# Patient Record
Sex: Female | Born: 1995 | Race: Black or African American | Hispanic: No | Marital: Single | Smoking: Never smoker
Health system: Southern US, Community
[De-identification: ages and names within clinical notes are randomized; demographics above are authoritative.]

## PROBLEM LIST (undated history)

## (undated) HISTORY — PX: NO PAST SURGERIES: SHX2092

---

## 2016-05-29 ENCOUNTER — Encounter (HOSPITAL_COMMUNITY): Payer: Self-pay

## 2016-05-29 ENCOUNTER — Emergency Department (HOSPITAL_COMMUNITY)
Admission: EM | Admit: 2016-05-29 | Discharge: 2016-05-29 | Disposition: A | Discharge status: Home / Self Care | Payer: BC Managed Care – PPO | Attending: Emergency Medicine | Admitting: Emergency Medicine

## 2016-05-29 DIAGNOSIS — S161XXA Strain of muscle, fascia and tendon at neck level, initial encounter: Secondary | ICD-10-CM | POA: Diagnosis not present

## 2016-05-29 DIAGNOSIS — Y9389 Activity, other specified: Secondary | ICD-10-CM | POA: Insufficient documentation

## 2016-05-29 DIAGNOSIS — Y92410 Unspecified street and highway as the place of occurrence of the external cause: Secondary | ICD-10-CM | POA: Diagnosis not present

## 2016-05-29 DIAGNOSIS — S39012A Strain of muscle, fascia and tendon of lower back, initial encounter: Secondary | ICD-10-CM

## 2016-05-29 DIAGNOSIS — Y999 Unspecified external cause status: Secondary | ICD-10-CM | POA: Insufficient documentation

## 2016-05-29 DIAGNOSIS — S199XXA Unspecified injury of neck, initial encounter: Secondary | ICD-10-CM | POA: Diagnosis present

## 2016-05-29 MED ORDER — IBUPROFEN 800 MG PO TABS: 800.0000 mg | ORAL_TABLET | Freq: Three times a day (TID) | ORAL | 0 refills | Status: DC

## 2016-05-29 MED ORDER — TRAMADOL HCL 50 MG PO TABS: 50.0000 mg | ORAL_TABLET | Freq: Four times a day (QID) | ORAL | 0 refills | Status: DC | PRN

## 2016-05-29 MED ORDER — OXYCODONE-ACETAMINOPHEN 5-325 MG PO TABS
1.0000 | ORAL_TABLET | Freq: Once | ORAL | Status: AC
  Administered 2016-05-29: 1 via ORAL
  Filled 2016-05-29: qty 1

## 2016-05-29 MED ORDER — CYCLOBENZAPRINE HCL 10 MG PO TABS: 10.0000 mg | ORAL_TABLET | Freq: Three times a day (TID) | ORAL | 0 refills | Status: DC | PRN

## 2016-05-29 NOTE — ED Notes (Signed)
ED Provider at bedside. 
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
## 2017-10-03 ENCOUNTER — Ambulatory Visit: Payer: Self-pay | Admitting: Nurse Practitioner

## 2017-10-03 DIAGNOSIS — Z111 Encounter for screening for respiratory tuberculosis: Secondary | ICD-10-CM

## 2017-10-19 ENCOUNTER — Ambulatory Visit: Payer: Self-pay | Admitting: Nurse Practitioner

## 2017-10-19 DIAGNOSIS — Z111 Encounter for screening for respiratory tuberculosis: Secondary | ICD-10-CM

## 2017-10-19 NOTE — Progress Notes (Signed)
Patient presents for PPD placement for school Denies previous positive TB test  Denies known exposure to TB   Tuberculin skin test applied to left ventral forearm.  Patient informed to return to [facility] in 48-72 hours for PPD read. Vaccine Information Statement provided to patient.    

## 2017-10-21 ENCOUNTER — Ambulatory Visit: Payer: Self-pay | Admitting: Nurse Practitioner

## 2017-10-21 VITALS — BP 118/78 | HR 86 | Temp 98.9°F | Resp 18 | Ht 66.0 in | Wt 182.0 lb

## 2017-10-21 DIAGNOSIS — Z0000 Encounter for general adult medical examination without abnormal findings: Secondary | ICD-10-CM

## 2017-10-21 LAB — TB SKIN TEST
INDURATION: 0 mm
Induration: 0 mm
TB SKIN TEST: NEGATIVE
TB Skin Test: NEGATIVE

## 2017-10-21 NOTE — Progress Notes (Signed)
Subjective:  Brittany Rhodes is a 22 y.o. female who presents for basic physical exam.  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  The patient denies any past medical history, to include heart disease, lung disease, kidney disease, diabetes, asthma, or bronchitis.  Patient denies any current health related concerns.  Patient does not take any medications, and has no allergies to medications.  She states her last menstrual period was last week.   The patient denies any risk of STD exposure, and any chance of pregnancy.  Patient's immunizations are up-to-date.  The patient denies any surgical history.  The patient denies any use of recreational drugs, alcohol, and does not smoke.  Social History   Tobacco Use  . Smoking status: Never Smoker  Substance Use Topics  . Alcohol use: No  . Drug use: Not on file    No Known Allergies  Current Outpatient Medications  Medication Sig Dispense Refill  . cyclobenzaprine (FLEXERIL) 10 MG tablet Take 1 tablet (10 mg total) by mouth 3 (three) times daily as needed for muscle spasms. (Patient not taking: Reported on 10/21/2017) 20 tablet 0  . ibuprofen (ADVIL,MOTRIN) 800 MG tablet Take 1 tablet (800 mg total) by mouth 3 (three) times daily. (Patient not taking: Reported on 10/21/2017) 21 tablet 0  . traMADol (ULTRAM) 50 MG tablet Take 1 tablet (50 mg total) by mouth every 6 (six) hours as needed. (Patient not taking: Reported on 10/21/2017) 15 tablet 0   No current facility-administered medications for this visit.     Review of Systems  Constitutional: Negative.   HENT: Negative.   Eyes: Negative.   Respiratory: Negative.   Cardiovascular: Negative.   Gastrointestinal: Negative.   Genitourinary: Negative.   Musculoskeletal: Negative.   Skin: Negative.   Neurological: Negative.   Endo/Heme/Allergies: Negative.   Psychiatric/Behavioral: Negative.      Objective:  BP 118/78 (BP Location: Right Arm, Patient Position: Sitting, Cuff Size:  Normal)   Pulse 86   Temp 98.9 F (37.2 C) (Oral)   Resp 18   Ht 5\' 6"  (1.676 m)   Wt 182 lb (82.6 kg)   SpO2 98%   BMI 29.38 kg/m   General Appearance:  Alert, cooperative, no distress, appears stated age  Head:  Normocephalic, without obvious abnormality, atraumatic  Eyes:  PERRL, conjunctiva/corneas clear, EOM's intact, fundi benign, both eyes  Ears:  Normal TM's and external ear canals, both ears  Nose: Nares normal, septum midline,mucosa normal, no drainage or sinus tenderness  Throat: Lips, mucosa, and tongue normal; teeth and gums normal  Neck: Supple, symmetrical, trachea midline, no adenopathy;  thyroid: not enlarged, symmetric, no tenderness/mass/nodules; no carotid bruit or JVD  Back:   Symmetric, no curvature, ROM normal, no CVA tenderness  Lungs:   Clear to auscultation bilaterally, respirations unlabored  Breasts:  Deferred  Heart:  Regular rate and rhythm, S1 and S2 normal, no murmur, rub, or gallop  Abdomen:   Soft, non-tender, bowel sounds active all four quadrants,  no masses, no organomegaly  Pelvic: Deferred  Extremities: Extremities normal, atraumatic, no cyanosis or edema  Pulses: 2+ and symmetric  Skin: Skin color, texture, turgor normal, no rashes or lesions  Lymph nodes: Cervical, supraclavicular nodes normal  Neurologic: Normal    Assessment:  basic physical exam    Plan:  Patient education provided.  No labs needed at this time.  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  Patient given information on health maintenance and health prevention for her age group. Patient will follow up with PCP.

## 2017-10-21 NOTE — Patient Instructions (Signed)
Health Maintenance, Female  The patient was seen in our office on 10/21/17.  The patient completed a basic physical exam and did not exhibit any health related conditions that would restrict or limit her from performing her job duties.  Adopting a healthy lifestyle and getting preventive care can go a long way to promote health and wellness. Talk with your health care provider about what schedule of regular examinations is right for you. This is a good chance for you to check in with your provider about disease prevention and staying healthy. In between checkups, there are plenty of things you can do on your own. Experts have done a lot of research about which lifestyle changes and preventive measures are most likely to keep you healthy. Ask your health care provider for more information. Weight and diet Eat a healthy diet  Be sure to include plenty of vegetables, fruits, low-fat dairy products, and lean protein.  Do not eat a lot of foods high in solid fats, added sugars, or salt.  Get regular exercise. This is one of the most important things you can do for your health. ? Most adults should exercise for at least 150 minutes each week. The exercise should increase your heart rate and make you sweat (moderate-intensity exercise). ? Most adults should also do strengthening exercises at least twice a week. This is in addition to the moderate-intensity exercise.  Maintain a healthy weight  Body mass index (BMI) is a measurement that can be used to identify possible weight problems. It estimates body fat based on height and weight. Your health care provider can help determine your BMI and help you achieve or maintain a healthy weight.  For females 10 years of age and older: ? A BMI below 18.5 is considered underweight. ? A BMI of 18.5 to 24.9 is normal. ? A BMI of 25 to 29.9 is considered overweight. ? A BMI of 30 and above is considered obese.  Watch levels of cholesterol and blood  lipids  You should start having your blood tested for lipids and cholesterol at 21 years of age, then have this test every 5 years.  You may need to have your cholesterol levels checked more often if: ? Your lipid or cholesterol levels are high. ? You are older than 21 years of age. ? You are at high risk for heart disease.  Cancer screening Lung Cancer  Lung cancer screening is recommended for adults 27-58 years old who are at high risk for lung cancer because of a history of smoking.  A yearly low-dose CT scan of the lungs is recommended for people who: ? Currently smoke. ? Have quit within the past 15 years. ? Have at least a 30-pack-year history of smoking. A pack year is smoking an average of one pack of cigarettes a day for 1 year.  Yearly screening should continue until it has been 15 years since you quit.  Yearly screening should stop if you develop a health problem that would prevent you from having lung cancer treatment.  Breast Cancer  Practice breast self-awareness. This means understanding how your breasts normally appear and feel.  It also means doing regular breast self-exams. Let your health care provider know about any changes, no matter how small.  If you are in your 20s or 30s, you should have a clinical breast exam (CBE) by a health care provider every 1-3 years as part of a regular health exam.  If you are 31 or older, have a  CBE every year. Also consider having a breast X-ray (mammogram) every year.  If you have a family history of breast cancer, talk to your health care provider about genetic screening.  If you are at high risk for breast cancer, talk to your health care provider about having an MRI and a mammogram every year.  Breast cancer gene (BRCA) assessment is recommended for women who have family members with BRCA-related cancers. BRCA-related cancers include: ? Breast. ? Ovarian. ? Tubal. ? Peritoneal cancers.  Results of the assessment will  determine the need for genetic counseling and BRCA1 and BRCA2 testing.  Cervical Cancer Your health care provider may recommend that you be screened regularly for cancer of the pelvic organs (ovaries, uterus, and vagina). This screening involves a pelvic examination, including checking for microscopic changes to the surface of your cervix (Pap test). You may be encouraged to have this screening done every 3 years, beginning at age 30.  For women ages 22-65, health care providers may recommend pelvic exams and Pap testing every 3 years, or they may recommend the Pap and pelvic exam, combined with testing for human papilloma virus (HPV), every 5 years. Some types of HPV increase your risk of cervical cancer. Testing for HPV may also be done on women of any age with unclear Pap test results.  Other health care providers may not recommend any screening for nonpregnant women who are considered low risk for pelvic cancer and who do not have symptoms. Ask your health care provider if a screening pelvic exam is right for you.  If you have had past treatment for cervical cancer or a condition that could lead to cancer, you need Pap tests and screening for cancer for at least 20 years after your treatment. If Pap tests have been discontinued, your risk factors (such as having a new sexual partner) need to be reassessed to determine if screening should resume. Some women have medical problems that increase the chance of getting cervical cancer. In these cases, your health care provider may recommend more frequent screening and Pap tests.  Colorectal Cancer  This type of cancer can be detected and often prevented.  Routine colorectal cancer screening usually begins at 21 years of age and continues through 21 years of age.  Your health care provider may recommend screening at an earlier age if you have risk factors for colon cancer.  Your health care provider may also recommend using home test kits to check  for hidden blood in the stool.  A small camera at the end of a tube can be used to examine your colon directly (sigmoidoscopy or colonoscopy). This is done to check for the earliest forms of colorectal cancer.  Routine screening usually begins at age 53.  Direct examination of the colon should be repeated every 5-10 years through 21 years of age. However, you may need to be screened more often if early forms of precancerous polyps or small growths are found.  Skin Cancer  Check your skin from head to toe regularly.  Tell your health care provider about any new moles or changes in moles, especially if there is a change in a mole's shape or color.  Also tell your health care provider if you have a mole that is larger than the size of a pencil eraser.  Always use sunscreen. Apply sunscreen liberally and repeatedly throughout the day.  Protect yourself by wearing long sleeves, pants, a wide-brimmed hat, and sunglasses whenever you are outside.  Heart disease,  diabetes, and high blood pressure  High blood pressure causes heart disease and increases the risk of stroke. High blood pressure is more likely to develop in: ? People who have blood pressure in the high end of the normal range (130-139/85-89 mm Hg). ? People who are overweight or obese. ? People who are African American.  If you are 61-68 years of age, have your blood pressure checked every 3-5 years. If you are 92 years of age or older, have your blood pressure checked every year. You should have your blood pressure measured twice-once when you are at a hospital or clinic, and once when you are not at a hospital or clinic. Record the average of the two measurements. To check your blood pressure when you are not at a hospital or clinic, you can use: ? An automated blood pressure machine at a pharmacy. ? A home blood pressure monitor.  If you are between 28 years and 39 years old, ask your health care provider if you should take  aspirin to prevent strokes.  Have regular diabetes screenings. This involves taking a blood sample to check your fasting blood sugar level. ? If you are at a normal weight and have a low risk for diabetes, have this test once every three years after 21 years of age. ? If you are overweight and have a high risk for diabetes, consider being tested at a younger age or more often. Preventing infection Hepatitis B  If you have a higher risk for hepatitis B, you should be screened for this virus. You are considered at high risk for hepatitis B if: ? You were born in a country where hepatitis B is common. Ask your health care provider which countries are considered high risk. ? Your parents were born in a high-risk country, and you have not been immunized against hepatitis B (hepatitis B vaccine). ? You have HIV or AIDS. ? You use needles to inject street drugs. ? You live with someone who has hepatitis B. ? You have had sex with someone who has hepatitis B. ? You get hemodialysis treatment. ? You take certain medicines for conditions, including cancer, organ transplantation, and autoimmune conditions.  Hepatitis C  Blood testing is recommended for: ? Everyone born from [birth year] through 1964. ? Anyone with known risk factors for hepatitis C.  Sexually transmitted infections (STIs)  You should be screened for sexually transmitted infections (STIs) including gonorrhea and chlamydia if: ? You are sexually active and are younger than 21 years of age. ? You are older than 21 years of age and your health care provider tells you that you are at risk for this type of infection. ? Your sexual activity has changed since you were last screened and you are at an increased risk for chlamydia or gonorrhea. Ask your health care provider if you are at risk.  If you do not have HIV, but are at risk, it may be recommended that you take a prescription medicine daily to prevent HIV infection. This is called  pre-exposure prophylaxis (PrEP). You are considered at risk if: ? You are sexually active and do not regularly use condoms or know the HIV status of your partner(s). ? You take drugs by injection. ? You are sexually active with a partner who has HIV.  Talk with your health care provider about whether you are at high risk of being infected with HIV. If you choose to begin PrEP, you should first be tested for HIV. You should  then be tested every 3 months for as long as you are taking PrEP. Pregnancy  If you are premenopausal and you may become pregnant, ask your health care provider about preconception counseling.  If you may become pregnant, take 400 to 800 micrograms (mcg) of folic acid every day.  If you want to prevent pregnancy, talk to your health care provider about birth control (contraception). Osteoporosis and menopause  Osteoporosis is a disease in which the bones lose minerals and strength with aging. This can result in serious bone fractures. Your risk for osteoporosis can be identified using a bone density scan.  If you are 15 years of age or older, or if you are at risk for osteoporosis and fractures, ask your health care provider if you should be screened.  Ask your health care provider whether you should take a calcium or vitamin D supplement to lower your risk for osteoporosis.  Menopause may have certain physical symptoms and risks.  Hormone replacement therapy may reduce some of these symptoms and risks. Talk to your health care provider about whether hormone replacement therapy is right for you. Follow these instructions at home:  Schedule regular health, dental, and eye exams.  Stay current with your immunizations.  Do not use any tobacco products including cigarettes, chewing tobacco, or electronic cigarettes.  If you are pregnant, do not drink alcohol.  If you are breastfeeding, limit how much and how often you drink alcohol.  Limit alcohol intake to no more  than 1 drink per day for nonpregnant women. One drink equals 12 ounces of beer, 5 ounces of wine, or 1 ounces of hard liquor.  Do not use street drugs.  Do not share needles.  Ask your health care provider for help if you need support or information about quitting drugs.  Tell your health care provider if you often feel depressed.  Tell your health care provider if you have ever been abused or do not feel safe at home. This information is not intended to replace advice given to you by your health care provider. Make sure you discuss any questions you have with your health care provider. Document Released: 11/13/2010 Document Revised: 10/06/2015 Document Reviewed: 02/01/2015 Elsevier Interactive Patient Education  2018 [place] 18-39 Years, Female Preventive care refers to lifestyle choices and visits with your health care provider that can promote health and wellness. What does preventive care include?  A yearly physical exam. This is also called an annual well check.  Dental exams once or twice a year.  Routine eye exams. Ask your health care provider how often you should have your eyes checked.  Personal lifestyle choices, including: ? Daily care of your teeth and gums. ? Regular physical activity. ? Eating a healthy diet. ? Avoiding tobacco and drug use. ? Limiting alcohol use. ? Practicing safe sex. ? Taking vitamin and mineral supplements as recommended by your health care provider. What happens during an annual well check? The services and screenings done by your health care provider during your annual well check will depend on your age, overall health, lifestyle risk factors, and family history of disease. Counseling Your health care provider may ask you questions about your:  Alcohol use.  Tobacco use.  Drug use.  Emotional well-being.  Home and relationship well-being.  Sexual activity.  Eating habits.  Work and work  Statistician.  Method of birth control.  Menstrual cycle.  Pregnancy history.  Screening You may have the following tests or measurements:  Height, weight, and BMI.  Diabetes screening. This is done by checking your blood sugar (glucose) after you have not eaten for a while (fasting).  Blood pressure.  Lipid and cholesterol levels. These may be checked every 5 years starting at age 77.  Skin check.  Hepatitis C blood test.  Hepatitis B blood test.  Sexually transmitted disease (STD) testing.  BRCA-related cancer screening. This may be done if you have a family history of breast, ovarian, tubal, or peritoneal cancers.  Pelvic exam and Pap test. This may be done every 3 years starting at age 71. Starting at age 57, this may be done every 5 years if you have a Pap test in combination with an HPV test.  Discuss your test results, treatment options, and if necessary, the need for more tests with your health care provider. Vaccines Your health care provider may recommend certain vaccines, such as:  Influenza vaccine. This is recommended every year.  Tetanus, diphtheria, and acellular pertussis (Tdap, Td) vaccine. You may need a Td booster every 10 years.  Varicella vaccine. You may need this if you have not been vaccinated.  HPV vaccine. If you are 74 or younger, you may need three doses over 6 months.  Measles, mumps, and rubella (MMR) vaccine. You may need at least one dose of MMR. You may also need a second dose.  Pneumococcal 13-valent conjugate (PCV13) vaccine. You may need this if you have certain conditions and were not previously vaccinated.  Pneumococcal polysaccharide (PPSV23) vaccine. You may need one or two doses if you smoke cigarettes or if you have certain conditions.  Meningococcal vaccine. One dose is recommended if you are age 65-21 years and a first-year college student living in a residence hall, or if you have one of several medical conditions. You may  also need additional booster doses.  Hepatitis A vaccine. You may need this if you have certain conditions or if you travel or work in places where you may be exposed to hepatitis A.  Hepatitis B vaccine. You may need this if you have certain conditions or if you travel or work in places where you may be exposed to hepatitis B.  Haemophilus influenzae type b (Hib) vaccine. You may need this if you have certain risk factors.  Talk to your health care provider about which screenings and vaccines you need and how often you need them. This information is not intended to replace advice given to you by your health care provider. Make sure you discuss any questions you have with your health care provider. Document Released: 06/26/2001 Document Revised: 01/18/2016 Document Reviewed: 03/01/2015 Elsevier Interactive Patient Education  [employer].

## 2017-12-14 ENCOUNTER — Other Ambulatory Visit: Payer: Self-pay

## 2017-12-14 ENCOUNTER — Emergency Department (HOSPITAL_COMMUNITY): Payer: BC Managed Care – PPO

## 2017-12-14 ENCOUNTER — Emergency Department (HOSPITAL_COMMUNITY)
Admission: EM | Admit: 2017-12-14 | Discharge: 2017-12-14 | Disposition: A | Discharge status: Home / Self Care | Payer: BC Managed Care – PPO | Attending: Emergency Medicine | Admitting: Emergency Medicine

## 2017-12-14 ENCOUNTER — Encounter: Payer: Self-pay | Admitting: Emergency Medicine

## 2017-12-14 DIAGNOSIS — Y939 Activity, unspecified: Secondary | ICD-10-CM | POA: Insufficient documentation

## 2017-12-14 DIAGNOSIS — W228XXA Striking against or struck by other objects, initial encounter: Secondary | ICD-10-CM | POA: Insufficient documentation

## 2017-12-14 DIAGNOSIS — Y999 Unspecified external cause status: Secondary | ICD-10-CM | POA: Insufficient documentation

## 2017-12-14 DIAGNOSIS — S0083XA Contusion of other part of head, initial encounter: Secondary | ICD-10-CM

## 2017-12-14 DIAGNOSIS — Y929 Unspecified place or not applicable: Secondary | ICD-10-CM | POA: Diagnosis not present

## 2017-12-14 DIAGNOSIS — S01411A Laceration without foreign body of right cheek and temporomandibular area, initial encounter: Secondary | ICD-10-CM | POA: Insufficient documentation

## 2017-12-14 DIAGNOSIS — S0181XA Laceration without foreign body of other part of head, initial encounter: Secondary | ICD-10-CM

## 2017-12-14 MED ORDER — FLUORESCEIN SODIUM 1 MG OP STRP
1.0000 | ORAL_STRIP | Freq: Once | OPHTHALMIC | Status: AC
Start: 2017-12-14 — End: 2017-12-14
  Administered 2017-12-14: 1 via OPHTHALMIC
  Filled 2017-12-14: qty 1

## 2017-12-14 MED ORDER — TETRACAINE HCL 0.5 % OP SOLN
2.0000 [drp] | Freq: Once | OPHTHALMIC | Status: AC
  Administered 2017-12-14: 2 [drp] via OPHTHALMIC
  Filled 2017-12-14: qty 4

## 2017-12-14 MED ORDER — LIDOCAINE-EPINEPHRINE (PF) 2 %-1:200000 IJ SOLN
10.0000 mL | Freq: Once | INTRAMUSCULAR | Status: AC
  Administered 2017-12-14: 10 mL
  Filled 2017-12-14: qty 20

## 2017-12-14 MED ORDER — OXYCODONE-ACETAMINOPHEN 5-325 MG PO TABS
2.0000 | ORAL_TABLET | Freq: Once | ORAL | Status: AC
  Administered 2017-12-14: 2 via ORAL
  Filled 2017-12-14: qty 2

## 2017-12-14 NOTE — ED Notes (Signed)
Able to view eye at this time, pupils equal and round, reactive to light.

## 2017-12-14 NOTE — Discharge Instructions (Signed)
Please read and follow all provided instructions.  Your diagnoses today include:  1. Facial laceration, initial encounter   2. Contusion of face, initial encounter     Tests performed today include:  CT scan of your face that did not show any broken bones.  Vital signs. See below for your results today.   Medications prescribed:   None  Take any prescribed medications only as directed.  Home care instructions:  Follow any educational materials contained in this packet.  Follow-up instructions: Please follow-up with your primary care provider in the next 3 days for further evaluation of your symptoms.   Return instructions:  SEEK IMMEDIATE MEDICAL ATTENTION IF:  There is confusion or drowsiness (although children frequently become drowsy after injury).   You cannot awaken the injured person.   You have more than one episode of vomiting.   You notice dizziness or unsteadiness which is getting worse, or inability to walk.   You have convulsions or unconsciousness.   You experience severe, persistent headaches not relieved by Tylenol.  You cannot use arms or legs normally.   There are changes in pupil sizes. (This is the black center in the colored part of the eye)   There is clear or bloody discharge from the nose or ears.   You have change in speech, vision, swallowing, or understanding.   Localized weakness, numbness, tingling, or change in bowel or bladder control.  You have any other emergent concerns.  Additional Information: You have had a head injury which does not appear to require admission at this time.  Your vital signs today were: BP 128/88 (BP Location: Right Arm)    Pulse 74    Temp 98.7 F (37.1 C) (Oral)    Resp 12    Ht 5\' 4"  (1.626 m)    Wt 72.6 kg (160 lb)    LMP 12/12/2017 (Approximate)    SpO2 100%    BMI 27.46 kg/m  If your blood pressure (BP) was elevated above 135/85 this visit, please have this repeated by your doctor within one  month. --------------

## 2017-12-14 NOTE — ED Notes (Signed)
Patient verbalizes understanding of discharge instructions. Opportunity for questioning and answers were provided. Armband removed by staff, pt discharged from ED.  
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
## 2017-12-14 NOTE — ED Notes (Signed)
Patient transported to CT 
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
## 2017-12-19 ENCOUNTER — Encounter (HOSPITAL_COMMUNITY): Payer: Self-pay

## 2017-12-19 ENCOUNTER — Emergency Department (HOSPITAL_COMMUNITY)
Admission: EM | Admit: 2017-12-19 | Discharge: 2017-12-19 | Disposition: A | Discharge status: Home / Self Care | Payer: BC Managed Care – PPO | Attending: Emergency Medicine | Admitting: Emergency Medicine

## 2017-12-19 ENCOUNTER — Other Ambulatory Visit: Payer: Self-pay

## 2017-12-19 DIAGNOSIS — Z4802 Encounter for removal of sutures: Secondary | ICD-10-CM

## 2017-12-19 NOTE — ED Provider Notes (Signed)
[facility]  CSN: [id number] Arrival date & time: 12/19/17  1043  History   Chief Complaint Chief Complaint  Patient presents with  . Suture / Staple Removal   HPI Brittany BootyDeja Dumler is a 22 y.o. female with no significant medical history who presented to the ED for suture removal. Patient had a laceration repaired on her right cheek below her right lower eyelid on 12/14/17. She reports appropriate healing. Denies fever, erythema, warmth or drainage. Denies eye pain, vision changes or headache.  History reviewed. No pertinent past medical history.  There are no active problems to display for this patient.   History reviewed. No pertinent surgical history.   OB History   None      Home Medications    Prior to Admission medications   Not on File    Family History History reviewed. No pertinent family history.  Social History Social History   Tobacco Use  . Smoking status: Never Smoker  . Smokeless tobacco: Never Used  Substance Use Topics  . Alcohol use: No  . Drug use: Not on file     Allergies   Patient has no known allergies.   Review of Systems Review of Systems  Constitutional: Negative for chills and fever.  HENT: Negative for facial swelling.   Eyes: Negative for pain and visual disturbance.  Musculoskeletal: Negative.   Skin: Positive for wound.  Neurological: Negative for headaches.  Hematological: Negative.      Physical Exam Updated Vital Signs BP (!) 135/93 (BP Location: Right Arm)   Pulse 73   Temp 98 F (36.7 C) (Oral)   Resp 16   LMP 12/12/2017 (Approximate)   SpO2 98%   Physical Exam  Constitutional: Vital signs are normal. She appears well-developed and well-nourished.  HENT:  Head: Normocephalic and atraumatic.  Eyes: Pupils are equal, round, and reactive to light. Conjunctivae, EOM and lids are normal.  Skin: Skin is warm and intact. Capillary refill takes less than 2 seconds.    Well healing 3 cm laceration below the right lower eyelid. No erythema, swelling or drainage/bleeding from site.  Nursing note and vitals reviewed.  ED Treatments / Results  Labs (all labs ordered are listed, but only abnormal results are displayed) Labs Reviewed - No data to display  EKG None  Radiology No results found.  Procedures .Suture Removal Date/Time: 12/19/2017 10:52 AM Performed by: Windy CarinaMortis, Braxton Weisbecker I, PA-C Authorized by: Windy CarinaMortis, Xerxes Agrusa I, PA-C   Consent:    Consent obtained:  Verbal   Consent given by:  Patient   Risks discussed:  Bleeding, pain and wound separation   Alternatives discussed:  No treatment Location:    Location:  Head/neck   Head/neck location:  Cheek   Cheek location:  L cheek Procedure details:    Number of sutures removed:  4 Post-procedure details:    Post-removal:  No dressing applied   Patient tolerance of procedure:  Tolerated well, no immediate complications   (including critical care time)  Medications Ordered in ED Medications - No data to display   Initial Impression / Assessment and Plan / ED Course  Triage vital signs and the nursing notes have been reviewed.  Pertinent labs & imaging results that were available during care of the patient were reviewed and considered in medical decision making (see chart for details).   Patient presented for suture removal. Wound was well-healing. No s/s of infection including erythema, streaking, warmth or drainage. 4 sutures removed  without complications.  Final Clinical Impressions(s) / ED Diagnoses  1. Suture Removal. 4 sutures removed without complication. Education provided on wound care and s/s of infection that would warrant medical follow-up.  Dispo: Home. After thorough clinical evaluation, this patient is determined to be medically stable and can be safely discharged with the previously mentioned treatment and/or outpatient follow-up/referral(s). At this time, there are no  other apparent medical conditions that require further screening, evaluation or treatment.   Final diagnoses:  Visit for suture removal    ED Discharge Orders    None        Reva Bores 12/19/17 1113    Raeford Razor, MD 12/19/17 [phone]

## 2017-12-19 NOTE — ED Triage Notes (Signed)
Pt has injury under her right eye, she is here to have sutures removed. Denies any s/s of infection. Wound clean dry and intact.

## 2017-12-19 NOTE — ED Notes (Signed)
Patient able to ambulate independently  

## 2017-12-19 NOTE — Discharge Instructions (Addendum)
All of your stitches were removed without issue. The area appears to be healing well. You can use your normal face wash to clean the area.  Follow-up with a medical provider if you have one or more of the following symptoms: fever; increased redness, warmth or tenderness at the wound site; pain beyond where the initial wound was; unusual discharge.

## 2018-12-01 ENCOUNTER — Encounter (HOSPITAL_COMMUNITY): Payer: Self-pay | Admitting: *Deleted

## 2018-12-01 ENCOUNTER — Other Ambulatory Visit: Payer: Self-pay

## 2018-12-01 ENCOUNTER — Inpatient Hospital Stay (HOSPITAL_COMMUNITY)
Admission: AD | Admit: 2018-12-01 | Discharge: 2018-12-01 | Disposition: A | Discharge status: Home / Self Care | Payer: BC Managed Care – PPO | Attending: Obstetrics & Gynecology | Admitting: Obstetrics & Gynecology

## 2018-12-01 ENCOUNTER — Inpatient Hospital Stay (HOSPITAL_COMMUNITY): Payer: BC Managed Care – PPO

## 2018-12-01 DIAGNOSIS — O208 Other hemorrhage in early pregnancy: Secondary | ICD-10-CM | POA: Insufficient documentation

## 2018-12-01 DIAGNOSIS — N760 Acute vaginitis: Secondary | ICD-10-CM

## 2018-12-01 DIAGNOSIS — O210 Mild hyperemesis gravidarum: Secondary | ICD-10-CM

## 2018-12-01 DIAGNOSIS — B9689 Other specified bacterial agents as the cause of diseases classified elsewhere: Secondary | ICD-10-CM | POA: Diagnosis not present

## 2018-12-01 DIAGNOSIS — O23591 Infection of other part of genital tract in pregnancy, first trimester: Secondary | ICD-10-CM | POA: Diagnosis not present

## 2018-12-01 DIAGNOSIS — O26899 Other specified pregnancy related conditions, unspecified trimester: Secondary | ICD-10-CM

## 2018-12-01 DIAGNOSIS — Z3A01 Less than 8 weeks gestation of pregnancy: Secondary | ICD-10-CM | POA: Diagnosis not present

## 2018-12-01 DIAGNOSIS — R1030 Lower abdominal pain, unspecified: Secondary | ICD-10-CM | POA: Diagnosis not present

## 2018-12-01 DIAGNOSIS — O418X90 Other specified disorders of amniotic fluid and membranes, unspecified trimester, not applicable or unspecified: Secondary | ICD-10-CM | POA: Diagnosis present

## 2018-12-01 DIAGNOSIS — Z3490 Encounter for supervision of normal pregnancy, unspecified, unspecified trimester: Secondary | ICD-10-CM

## 2018-12-01 DIAGNOSIS — O2340 Unspecified infection of urinary tract in pregnancy, unspecified trimester: Secondary | ICD-10-CM | POA: Diagnosis present

## 2018-12-01 DIAGNOSIS — O418X10 Other specified disorders of amniotic fluid and membranes, first trimester, not applicable or unspecified: Secondary | ICD-10-CM

## 2018-12-01 DIAGNOSIS — O26891 Other specified pregnancy related conditions, first trimester: Secondary | ICD-10-CM | POA: Diagnosis present

## 2018-12-01 DIAGNOSIS — O468X1 Other antepartum hemorrhage, first trimester: Secondary | ICD-10-CM

## 2018-12-01 DIAGNOSIS — R109 Unspecified abdominal pain: Secondary | ICD-10-CM

## 2018-12-01 DIAGNOSIS — O2341 Unspecified infection of urinary tract in pregnancy, first trimester: Secondary | ICD-10-CM | POA: Insufficient documentation

## 2018-12-01 LAB — CBC
HCT: 35.6 % — ABNORMAL LOW (ref 36.0–46.0)
Hemoglobin: 11.7 g/dL — ABNORMAL LOW (ref 12.0–15.0)
MCH: 28.2 pg (ref 26.0–34.0)
MCHC: 32.9 g/dL (ref 30.0–36.0)
MCV: 85.8 fL (ref 80.0–100.0)
Platelets: 296 10*3/uL (ref 150–400)
RBC: 4.15 MIL/uL (ref 3.87–5.11)
RDW: 16.2 % — ABNORMAL HIGH (ref 11.5–15.5)
WBC: 7.7 10*3/uL (ref 4.0–10.5)
nRBC: 0 % (ref 0.0–0.2)

## 2018-12-01 LAB — URINALYSIS, ROUTINE W REFLEX MICROSCOPIC
Bilirubin Urine: NEGATIVE
Glucose, UA: NEGATIVE mg/dL
Hgb urine dipstick: NEGATIVE
Ketones, ur: NEGATIVE mg/dL
Nitrite: POSITIVE — AB
Protein, ur: NEGATIVE mg/dL
Specific Gravity, Urine: 1.018 (ref 1.005–1.030)
pH: 6 (ref 5.0–8.0)

## 2018-12-01 LAB — WET PREP, GENITAL
Sperm: NONE SEEN
Trich, Wet Prep: NONE SEEN
Yeast Wet Prep HPF POC: NONE SEEN

## 2018-12-01 LAB — POCT PREGNANCY, URINE: Preg Test, Ur: POSITIVE — AB

## 2018-12-01 LAB — HCG, QUANTITATIVE, PREGNANCY: hCG, Beta Chain, Quant, S: 69838 m[IU]/mL — ABNORMAL HIGH (ref <5)

## 2018-12-01 MED ORDER — CEFADROXIL 500 MG PO CAPS: 500.0000 mg | ORAL_CAPSULE | Freq: Two times a day (BID) | ORAL | 0 refills | Status: DC

## 2018-12-01 MED ORDER — PROMETHAZINE HCL 25 MG PO TABS
25.0000 mg | ORAL_TABLET | Freq: Once | ORAL | Status: AC
  Administered 2018-12-01: 25 mg via ORAL
  Filled 2018-12-01: qty 1

## 2018-12-01 MED ORDER — PROMETHAZINE HCL 25 MG PO TABS: 25.0000 mg | ORAL_TABLET | Freq: Every evening | ORAL | 3 refills | Status: DC | PRN

## 2018-12-01 MED ORDER — METRONIDAZOLE 0.75 % VA GEL: 1.0000 | Freq: Every day | VAGINAL | 0 refills | Status: AC

## 2018-12-01 MED ORDER — METOCLOPRAMIDE HCL 10 MG PO TABS: 10.0000 mg | ORAL_TABLET | Freq: Four times a day (QID) | ORAL | 0 refills | Status: DC | PRN

## 2018-12-01 NOTE — Discharge Instructions (Signed)
[facility] Prenatal Care Providers          [facility] @ [facility]   Phone: [phone]  [facility] @ [facility]   Phone: [phone]  [facility]       Phone: [phone]            [facility] @ [place]     Phone: [phone]          [facility] @ [place]   Phone: [phone]  [facility] @ [facility]  Phone: [phone]     [facility] @ [facility]  Phone: [phone]  Safe Medications in Pregnancy   Acne: Benzoyl Peroxide Salicylic Acid  Backache/Headache: Tylenol: 2 regular strength every 4 hours OR              2 Extra strength every 6 hours  Colds/Coughs/Allergies: Benadryl (alcohol free) 25 mg every 6 hours as needed Breath right strips Claritin Cepacol throat lozenges Chloraseptic throat spray Cold-Eeze- up to three times per day Cough drops, alcohol free Flonase (by prescription only) Guaifenesin Mucinex Robitussin DM (plain only, alcohol free) Saline nasal spray/drops Sudafed (pseudoephedrine) & Actifed ** use only after [redacted] weeks gestation and if you do not have high blood pressure Tylenol Vicks Vaporub Zinc lozenges Zyrtec   Constipation: Colace Ducolax suppositories Fleet enema Glycerin suppositories Metamucil Milk of magnesia Miralax Senokot Smooth move tea  Diarrhea: Kaopectate Imodium A-D  *NO pepto Bismol  Hemorrhoids: Anusol Anusol HC Preparation H Tucks  Indigestion: Tums Maalox Mylanta Zantac  Pepcid  Insomnia: Benadryl (alcohol free) 25mg every 6 hours as needed Tylenol PM Unisom, no Gelcaps  Leg Cramps: Tums MagGel  Nausea/Vomiting:  Bonine Dramamine Emetrol Ginger extract Sea bands Meclizine  Nausea medication to take during pregnancy:  Unisom (doxylamine succinate 25 mg tablets) Take one tablet daily at bedtime. If symptoms are not  adequately controlled, the dose can be increased to a maximum recommended dose of two tablets daily (1/2 tablet in the morning, 1/2 tablet mid-afternoon and one at bedtime). Vitamin B6 100mg tablets. Take one tablet twice a day (up to 200 mg per day).  Skin Rashes: Aveeno products Benadryl cream or 25mg every 6 hours as needed Calamine Lotion 1% cortisone cream  Yeast infection: Gyne-lotrimin 7 Monistat 7   **If taking multiple medications, please check labels to avoid duplicating the same active ingredients **take medication as directed on the label ** Do not exceed 4000 mg of tylenol in 24 hours **Do not take medications that contain aspirin or ibuprofen     

## 2018-12-01 NOTE — MAU Note (Signed)
.   Brittany Rhodes is a 23 y.o. at [redacted]w[redacted]d here in MAU reporting: lower abdominal discomfort for about a week. Passed a small amount of discharge last not but not today LMP: 10/13/18 Onset of complaint: week Pain score: 5 Vitals:   12/01/18 1430  BP: (!) 151/82  Pulse: 88  Resp: 16  Temp: 98 F (36.7 C)  SpO2: 100%      Lab orders placed from triage: UA/UPT

## 2018-12-01 NOTE — MAU Provider Note (Signed)
History     CSN: [id number]  Arrival date and time: 12/01/18 1353   First Provider Initiated Contact with Patient 12/01/18 1554      Chief Complaint  Patient presents with  . Abdominal Pain   HPI  Ms.  Brittany Rhodes is a 23 y.o. year old G1P0 female at 3236w0d weeks gestation who presents to MAU reporting lower abdominal pain x 1 week, passes a small amount of vaginal discharge last night, but none today. She reports that her "pee has a strong odor." She has a h/o CT about 6 mos ago. She is concerned that she might have a STI. She also has c/o "morning sickness" and wants Rx for antiemetic.   History reviewed. No pertinent past medical history.  History reviewed. No pertinent surgical history.  History reviewed. No pertinent family history.  Social History   Tobacco Use  . Smoking status: Never Smoker  . Smokeless tobacco: Never Used  Substance Use Topics  . Alcohol use: No  . Drug use: Not Currently    Allergies: No Known Allergies  No medications prior to admission.    Review of Systems  Constitutional: Negative.   HENT: Negative.   Eyes: Negative.   Respiratory: Negative.   Cardiovascular: Negative.   Gastrointestinal: Negative.   Endocrine: Negative.   Genitourinary: Positive for vaginal discharge.       "strong urine smell"  Musculoskeletal: Negative.   Skin: Negative.   Allergic/Immunologic: Negative.   Neurological: Negative.   Hematological: Negative.   Psychiatric/Behavioral: Negative.    Physical Exam   Patient Vitals for the past 24 hrs:  BP Temp Pulse Resp SpO2  12/01/18 1432 127/73 - - - -  12/01/18 1430 (!) 151/82 98 F (36.7 C) 88 16 100 %     Physical Exam  Nursing note and vitals reviewed. Constitutional: She is oriented to person, place, and time. She appears well-developed and well-nourished.  HENT:  Head: Normocephalic and atraumatic.  Eyes: Pupils are equal, round, and reactive to light.  Neck: Normal range of motion.   Cardiovascular: Normal rate.  Respiratory: Effort normal.  GI: Soft.  Genitourinary:    Genitourinary Comments: Uterus: non-tender, SE: cervix is smooth, pink, no lesions, small amt of thin, white vaginal d/c -- WP, GC/CT done, closed/long/firm, no CMT or friability, no adnexal tenderness    Neurological: She is alert and oriented to person, place, and time. She has normal reflexes.  Skin: Skin is warm and dry.  Psychiatric: She has a normal mood and affect. Her behavior is normal. Judgment and thought content normal.    MAU Course  Procedures  MDM CCUA UPT CBC ABO/Rh HCG Wet Prep GC/CT -- pending HIV -- pending OB < 14 wks US with TV Phenergan 25 mg po -- nausea resolved   Results for orders placed or performed during the hospital encounter of 12/01/18 (from the past 24 hour(s))  Pregnancy, urine POC     Status: Abnormal   Collection Time: 12/01/18  2:29 PM  Result Value Ref Range   Preg Test, Ur POSITIVE (A) NEGATIVE  Urinalysis, Routine w reflex microscopic     Status: Abnormal   Collection Time: 12/01/18  2:30 PM  Result Value Ref Range   Color, Urine AMBER (A) YELLOW   APPearance CLOUDY (A) CLEAR   Specific Gravity, Urine 1.018 1.005 - 1.030   pH 6.0 5.0 - 8.0   Glucose, UA NEGATIVE NEGATIVE mg/dL   Hgb urine dipstick NEGATIVE NEGATIVE   Bilirubin Urine  NEGATIVE NEGATIVE   Ketones, ur NEGATIVE NEGATIVE mg/dL   Protein, ur NEGATIVE NEGATIVE mg/dL   Nitrite POSITIVE (A) NEGATIVE   Leukocytes,Ua MODERATE (A) NEGATIVE   RBC / HPF 0-5 0 - 5 RBC/hpf   WBC, UA 21-50 0 - 5 WBC/hpf   Bacteria, UA MANY (A) NONE SEEN   Squamous Epithelial / LPF 11-20 0 - 5   Mucus PRESENT   CBC     Status: Abnormal   Collection Time: 12/01/18  3:17 PM  Result Value Ref Range   WBC 7.7 4.0 - 10.5 K/uL   RBC 4.15 3.87 - 5.11 MIL/uL   Hemoglobin 11.7 (L) 12.0 - 15.0 g/dL   HCT 35.6 (L) 36.0 - 46.0 %   MCV 85.8 80.0 - 100.0 fL   MCH 28.2 26.0 - 34.0 pg   MCHC 32.9 30.0 - 36.0 g/dL    RDW 16.2 (H) 11.5 - 15.5 %   Platelets 296 150 - 400 K/uL   nRBC 0.0 0.0 - 0.2 %  ABO/Rh     Status: None   Collection Time: 12/01/18  3:17 PM  Result Value Ref Range   ABO/RH(D)      O POS Performed at [facility], [address] [place], [place] [address]   hCG, quantitative, pregnancy     Status: Abnormal   Collection Time: 12/01/18  3:17 PM  Result Value Ref Range   hCG, Beta [name], [name] 69,838 (H) <5 mIU/mL  Wet prep, genital     Status: Abnormal   Collection Time: 12/01/18  4:02 PM  Result Value Ref Range   Yeast Wet Prep HPF POC NONE SEEN NONE SEEN   Trich, Wet Prep NONE SEEN NONE SEEN   Clue Cells Wet Prep HPF POC PRESENT (A) NONE SEEN   WBC, Wet Prep HPF POC MODERATE (A) NONE SEEN   Sperm NONE SEEN     US Ob Less Than 14 Weeks With Ob Transvaginal  Result Date: 12/01/2018 CLINICAL DATA:  Pregnancy. EXAM: OBSTETRIC <14 WK [place] AND TRANSVAGINAL OB US TECHNIQUE: Both transabdominal and transvaginal ultrasound examinations were performed for complete evaluation of the gestation as well as the maternal uterus, adnexal regions, and pelvic cul-de-sac. Transvaginal technique was performed to assess early pregnancy. COMPARISON:  None. FINDINGS: Intrauterine gestational sac: Single Yolk sac:  Visualized. Embryo:  Visualized. Cardiac Activity: Visualized. Heart Rate: 122 bpm CRL:   5.33 mm   6 w 1 d                  [place] EDC: 07/26/2019 Subchorionic hemorrhage: 2 small subchorionic hemorrhages, one of which is along the fundal aspect measuring 1.8 x 0.5 x 1.5 cm. Additional small subchorionic hemorrhage along the inferior margin of the gestational sac measuring 1.6 x 1.8 x 0.4 cm. Maternal uterus/adnexae: Within normal limits. IMPRESSION: Single live intrauterine gestation measuring 6 weeks 1 day by crown-rump length. Estimated date of confinement: 07/26/2019. Electronically Signed   By: Davina Poke M.D.   On: 12/01/2018 17:24     Assessment and Plan  Bacterial vaginosis   - Rx for Metrogel sent - Information provided on BV   Abdominal pain during pregnancy in first trimester  - Advised that abd discomfort is more than likely caused by BV - Advised to take Tylenol 1000 mg po every 6 hrs prn  Morning sickness  - Rx for Reglan 10 mg every 6 hrs until 2 pm and Phenergan 25 mg hs - Information provided on  morning sickness   Subchorionic hematoma in first trimester, single or unspecified fetus  - Information provided on [facility] & threatened miscarriage - Bleeding precautions reviewed  Urinary tract infection in mother during first trimester of pregnancy  - Rx for Cefadroxil 500 mg po BID sent - Advised of UCx pending -- may get a call to change abx, if not susceptible to abx Rx'd   - [facility]charge patient - [facility] - Patient verbalized an understanding of the plan of care and agrees.     Raelyn Moraolitta Hillman Attig, MSN, CNM 12/01/2018, 3:54 PM

## 2018-12-02 DIAGNOSIS — O2340 Unspecified infection of urinary tract in pregnancy, unspecified trimester: Secondary | ICD-10-CM | POA: Diagnosis present

## 2018-12-02 LAB — GC/CHLAMYDIA PROBE AMP (~~LOC~~) NOT AT ARMC
Chlamydia: NEGATIVE
Neisseria Gonorrhea: NEGATIVE

## 2018-12-02 LAB — HIV ANTIBODY (ROUTINE TESTING W REFLEX): HIV Screen 4th Generation wRfx: NONREACTIVE

## 2018-12-02 LAB — ABO/RH: ABO/RH(D): O POS

## 2018-12-03 LAB — CULTURE, OB URINE

## 2019-01-13 ENCOUNTER — Other Ambulatory Visit: Payer: Self-pay

## 2019-01-13 ENCOUNTER — Ambulatory Visit (INDEPENDENT_AMBULATORY_CARE_PROVIDER_SITE_OTHER): Payer: BC Managed Care – PPO | Admitting: Advanced Practice Midwife

## 2019-01-13 ENCOUNTER — Encounter: Payer: Self-pay | Admitting: Advanced Practice Midwife

## 2019-01-13 ENCOUNTER — Other Ambulatory Visit (HOSPITAL_COMMUNITY)
Admission: RE | Admit: 2019-01-13 | Discharge: 2019-01-13 | Disposition: A | Discharge status: Home / Self Care | Payer: BC Managed Care – PPO | Source: Ambulatory Visit | Attending: Advanced Practice Midwife | Admitting: Advanced Practice Midwife

## 2019-01-13 VITALS — BP 105/72 | HR 85 | Wt 169.0 lb

## 2019-01-13 DIAGNOSIS — Z3400 Encounter for supervision of normal first pregnancy, unspecified trimester: Secondary | ICD-10-CM | POA: Insufficient documentation

## 2019-01-13 DIAGNOSIS — O468X1 Other antepartum hemorrhage, first trimester: Secondary | ICD-10-CM

## 2019-01-13 DIAGNOSIS — B3731 Acute candidiasis of vulva and vagina: Secondary | ICD-10-CM

## 2019-01-13 DIAGNOSIS — O219 Vomiting of pregnancy, unspecified: Secondary | ICD-10-CM

## 2019-01-13 DIAGNOSIS — Z3481 Encounter for supervision of other normal pregnancy, first trimester: Secondary | ICD-10-CM | POA: Diagnosis not present

## 2019-01-13 DIAGNOSIS — O210 Mild hyperemesis gravidarum: Secondary | ICD-10-CM

## 2019-01-13 DIAGNOSIS — O418X10 Other specified disorders of amniotic fluid and membranes, first trimester, not applicable or unspecified: Secondary | ICD-10-CM

## 2019-01-13 DIAGNOSIS — Z3A12 12 weeks gestation of pregnancy: Secondary | ICD-10-CM

## 2019-01-13 DIAGNOSIS — B373 Candidiasis of vulva and vagina: Secondary | ICD-10-CM

## 2019-01-13 MED ORDER — ONDANSETRON 4 MG PO TBDP: 4.0000 mg | ORAL_TABLET | Freq: Four times a day (QID) | ORAL | 2 refills | Status: DC | PRN

## 2019-01-13 MED ORDER — DOXYLAMINE-PYRIDOXINE 10-10 MG PO TBEC: DELAYED_RELEASE_TABLET | ORAL | 5 refills | Status: DC

## 2019-01-13 MED ORDER — BLOOD PRESSURE MONITOR KIT: 1.0000 | PACK | 0 refills | Status: DC

## 2019-01-13 NOTE — Progress Notes (Signed)
NOB  CC: Severe Nausea and vomiting onset since finding out about pregnancy.  Pt recently stopped taking nausea Rx due to not helping her feel any better. Not able to eat.    Genetic Screening: Desires   Patient consents to Female student in exam room.

## 2019-01-13 NOTE — Progress Notes (Signed)
Subjective:   Brittany Rhodes is a 23 y.o. G1P0 at [redacted]w[redacted]d by early ultrasound being seen today for her first obstetrical visit.  Her obstetrical history is significant for none and has Morning sickness; Subchorionic hematoma; and Supervision of normal first pregnancy, antepartum on their problem list.. Patient does intend to breast feed. Pregnancy history fully reviewed.  Patient reports fatigue, nausea and vomiting.  HISTORY: OB History  Gravida Para Term Preterm AB Living  1 0 0 0 0 0  SAB TAB Ectopic Multiple Live Births  0 0 0 0 0    # Outcome Date GA Lbr Len/2nd Weight Sex Delivery Anes PTL Lv  1 Current            History reviewed. No pertinent past medical history. History reviewed. No pertinent surgical history. History reviewed. No pertinent family history. Social History   Tobacco Use  . Smoking status: Never Smoker  . Smokeless tobacco: Never Used  Substance Use Topics  . Alcohol use: No  . Drug use: Not Currently   No Known Allergies Current Outpatient Medications on File Prior to Visit  Medication Sig Dispense Refill  . cefadroxil (DURICEF) 500 MG capsule Take 1 capsule (500 mg total) by mouth 2 (two) times daily. (Patient not taking: Reported on 01/13/2019) 20 capsule 0  . metoCLOPramide (REGLAN) 10 MG tablet Take 1 tablet (10 mg total) by mouth every 6 (six) hours as needed for nausea or vomiting. (Patient not taking: Reported on 01/13/2019) 60 tablet 0  . promethazine (PHENERGAN) 25 MG tablet Take 1 tablet (25 mg total) by mouth at bedtime as needed for nausea or vomiting. (Patient not taking: Reported on 01/13/2019) 30 tablet 3   No current facility-administered medications on file prior to visit.     Exam   Vitals:   01/13/19 1343  BP: 105/72  Pulse: 85  Weight: 169 lb (76.7 kg)   Fetal Heart Rate (bpm): 158  Uterus:     Pelvic Exam: Perineum: no hemorrhoids, normal perineum   Vulva: normal external genitalia, no lesions   Vagina:  normal mucosa,  normal discharge   Cervix: no lesions and normal, pap smear done.    Adnexa: normal adnexa and no mass, fullness, tenderness   Bony Pelvis: average  System: General: well-developed, well-nourished female in no acute distress   Breast:  normal appearance, no masses or tenderness   Skin: normal coloration and turgor, no rashes   Neurologic: oriented, normal, negative, normal mood   Extremities: normal strength, tone, and muscle mass, ROM of all joints is normal   HEENT PERRLA, extraocular movement intact and sclera clear, anicteric   Mouth/Teeth mucous membranes moist, pharynx normal without lesions and dental hygiene good   Neck supple and no masses   Cardiovascular: regular rate and rhythm   Respiratory:  no respiratory distress, normal breath sounds   Abdomen: soft, non-tender; bowel sounds normal; no masses,  no organomegaly     Assessment:   Pregnancy: G1P0 Patient Active Problem List   Diagnosis Date Noted  . Supervision of normal first pregnancy, antepartum 01/13/2019  . Morning sickness 12/01/2018  . Subchorionic hematoma 12/01/2018     Plan:   1. Supervision of normal first pregnancy, antepartum --Anticipatory guidance about next visits/weeks of pregnancy given. - Culture, OB Urine - Obstetric Panel, Including HIV - Genetic Screening - Cervicovaginal ancillary only( [place]) - Cytology - PAP - Babyscripts Schedule Optimization - Enroll Patient in Babyscripts - [place] MFM OB COMP +  14 WK; Future  2. Nausea and vomiting during pregnancy prior to [redacted] weeks gestation --Pt tried Reglan in the daytime, Phenergan at night as prescribed in MAU but did not helped so stopped taking both medications.  Nausea is constant, but vomiting has improved recently. Pt is tolerated PO fluids but not food.   --Change regimen to Diclegis daily, add Phenergan and /or Zofran PRN.  - Doxylamine-Pyridoxine (DICLEGIS) 10-10 MG TBEC; Take 2 tabs at bedtime. If needed, add another tab in the  morning. If needed, add another tab in the afternoon, up to 4 tabs/day.  Dispense: 100 tablet; Refill: 5 - ondansetron (ZOFRAN ODT) 4 MG disintegrating tablet; Take 1 tablet (4 mg total) by mouth every 6 (six) hours as needed for nausea.  Dispense: 20 tablet; Refill: 2  3. Hyperemesis gravidarum, antepartum --With 10+ lbs weight loss per pt, pt vomiting less in last 2 weeks but with poor appetite, continuous nausea. See treatments above. --If no improvement, consider steroid taper --Reasons to seek care reviewed  4. Subchorionic hematoma in first trimester, single or unspecified fetus --No bleeding, found incidentally on US in first trimester.  --FHT wnl today, reassurance provided to pt    Initial labs drawn. Continue prenatal vitamins. Discussed and offered genetic screening options, including Quad screen/AFP, NIPS testing, and option to decline testing. Benefits/risks/alternatives reviewed. Pt aware that anatomy US is form of genetic screening with lower accuracy in detecting trisomies than blood work.  Pt chooses genetic screening today. NIPS: requested. Ultrasound discussed; fetal anatomic survey: ordered. Problem list reviewed and updated. The nature of [place] [facility] with multiple MDs and other Advanced Practice Providers was explained to patient; also emphasized that residents, students are part of our team. Routine obstetric precautions reviewed. No follow-ups on file.   Sharen CounterLisa Leftwich-Kirby, CNM 01/13/19 3:31 PM

## 2019-01-14 LAB — CYTOLOGY - PAP: Diagnosis: NEGATIVE

## 2019-01-14 LAB — OBSTETRIC PANEL, INCLUDING HIV
Antibody Screen: NEGATIVE
Basophils Absolute: 0 10*3/uL (ref 0.0–0.2)
Basos: 0 %
EOS (ABSOLUTE): 0 10*3/uL (ref 0.0–0.4)
Eos: 0 %
HIV Screen 4th Generation wRfx: NONREACTIVE
Hematocrit: 35.7 % (ref 34.0–46.6)
Hemoglobin: 11.7 g/dL (ref 11.1–15.9)
Hepatitis B Surface Ag: NEGATIVE
Immature Grans (Abs): 0 10*3/uL (ref 0.0–0.1)
Immature Granulocytes: 0 %
Lymphocytes Absolute: 1.3 10*3/uL (ref 0.7–3.1)
Lymphs: 23 %
MCH: 28.7 pg (ref 26.6–33.0)
MCHC: 32.8 g/dL (ref 31.5–35.7)
MCV: 88 fL (ref 79–97)
Monocytes Absolute: 0.4 10*3/uL (ref 0.1–0.9)
Monocytes: 7 %
Neutrophils Absolute: 3.9 10*3/uL (ref 1.4–7.0)
Neutrophils: 70 %
Platelets: 296 10*3/uL (ref 150–450)
RBC: 4.08 x10E6/uL (ref 3.77–5.28)
RDW: 16.8 % — ABNORMAL HIGH (ref 11.7–15.4)
RPR Ser Ql: NONREACTIVE
Rh Factor: POSITIVE
Rubella Antibodies, IGG: 1.13 index (ref >0.99)
WBC: 5.6 10*3/uL (ref 3.4–10.8)

## 2019-01-15 MED ORDER — TERCONAZOLE 0.4 % VA CREA: 1.0000 | TOPICAL_CREAM | Freq: Every day | VAGINAL | 0 refills | Status: DC

## 2019-01-15 NOTE — Addendum Note (Signed)
Addended by: Fatima Blank A on: 01/15/2019 08:30 AM   Modules accepted: Orders

## 2019-01-16 LAB — URINE CULTURE, OB REFLEX

## 2019-01-16 LAB — CULTURE, OB URINE

## 2019-01-21 ENCOUNTER — Encounter: Payer: Self-pay | Admitting: Advanced Practice Midwife

## 2019-01-23 ENCOUNTER — Encounter: Payer: Self-pay | Admitting: Advanced Practice Midwife

## 2019-01-26 ENCOUNTER — Encounter: Payer: Self-pay | Admitting: Advanced Practice Midwife

## 2019-01-27 ENCOUNTER — Telehealth: Payer: Self-pay | Admitting: Advanced Practice Midwife

## 2019-01-27 NOTE — Telephone Encounter (Signed)
Pt treated for hyperemesis with 12 lb weight loss in first trimester. Called to see if symptoms improved.  Busy signal so unable to leave message. Pt has next prenatal appt 02/10/19 with me at [facility].

## 2019-02-10 ENCOUNTER — Ambulatory Visit (INDEPENDENT_AMBULATORY_CARE_PROVIDER_SITE_OTHER): Payer: BC Managed Care – PPO | Admitting: Advanced Practice Midwife

## 2019-02-10 ENCOUNTER — Other Ambulatory Visit: Payer: Self-pay

## 2019-02-10 VITALS — BP 113/78 | HR 89 | Wt 175.0 lb

## 2019-02-10 DIAGNOSIS — Z3400 Encounter for supervision of normal first pregnancy, unspecified trimester: Secondary | ICD-10-CM

## 2019-02-10 DIAGNOSIS — O219 Vomiting of pregnancy, unspecified: Secondary | ICD-10-CM

## 2019-02-10 DIAGNOSIS — Z3A16 16 weeks gestation of pregnancy: Secondary | ICD-10-CM

## 2019-02-10 NOTE — Progress Notes (Signed)
   PRENATAL VISIT NOTE  Subjective:  Paxtyn Wisdom is a 23 y.o. G1P0 at [redacted]w[redacted]d being seen today for ongoing prenatal care.  She is currently monitored for the following issues for this low-risk pregnancy and has Morning sickness; Subchorionic hematoma; and Supervision of normal first pregnancy, antepartum on their problem list.  Patient reports no complaints.  Contractions: Not present. Vag. Bleeding: None.  Movement: Present. Denies leaking of fluid.   The following portions of the patient's history were reviewed and updated as appropriate: allergies, current medications, past family history, past medical history, past social history, past surgical history and problem list.   Objective:   Vitals:   02/10/19 1117  BP: 113/78  Pulse: 89  Weight: 175 lb (79.4 kg)    Fetal Status: Fetal Heart Rate (bpm): 150   Movement: Present     General:  Alert, oriented and cooperative. Patient is in no acute distress.  Skin: Skin is warm and dry. No rash noted.   Cardiovascular: Normal heart rate noted  Respiratory: Normal respiratory effort, no problems with respiration noted  Abdomen: Soft, gravid, appropriate for gestational age.  Pain/Pressure: Present     Pelvic: Cervical exam deferred        Extremities: Normal range of motion.     Mental Status: Normal mood and affect. Normal behavior. Normal judgment and thought content.   Assessment and Plan:  Pregnancy: G1P0 at [redacted]w[redacted]d  1. Supervision of normal first pregnancy, antepartum --Anticipatory guidance about next visits/weeks of pregnancy given. - AFP, Serum, Open Spina Bifida --Next visit in 4 weeks, virtual  2. Nausea and vomiting during pregnancy prior to [redacted] weeks gestation --Doing better, trouble with insurance card so never picked up Diclegis or Zofran.  Now only has emesis 1-2 times per week, is able to eat/drink more.   --Pt can pick up Rx PRN if symptoms change.  --Initially pt lost 12 lbs but is now gaining. Net loss of 5 lbs  currently.  Discussed recommended weight gain in pregnancy.  Preterm labor symptoms and general obstetric precautions including but not limited to vaginal bleeding, contractions, leaking of fluid and fetal movement were reviewed in detail with the patient. Please refer to After Visit Summary for other counseling recommendations.   No follow-ups on file.  Future Appointments  Date Time Provider [place]  03/03/2019  8:30 AM [place] 5 WH-[facility]    Fatima Blank, CNM

## 2019-02-10 NOTE — Patient Instructions (Signed)

## 2019-02-13 LAB — AFP, SERUM, OPEN SPINA BIFIDA
AFP MoM: 1.18
AFP Value: 40.1 ng/mL
Gest. Age on Collection Date: 16.2 weeks
Maternal Age At EDD: 23 yr
OSBR Risk 1 IN: 10000
Test Results:: NEGATIVE
Weight: 175 [lb_av]

## 2019-03-03 ENCOUNTER — Ambulatory Visit (HOSPITAL_COMMUNITY)
Admission: RE | Admit: 2019-03-03 | Discharge: 2019-03-03 | Disposition: A | Discharge status: Home / Self Care | Payer: BC Managed Care – PPO | Source: Ambulatory Visit | Attending: Obstetrics and Gynecology | Admitting: Obstetrics and Gynecology

## 2019-03-03 ENCOUNTER — Other Ambulatory Visit: Payer: Self-pay

## 2019-03-03 ENCOUNTER — Other Ambulatory Visit (HOSPITAL_COMMUNITY): Payer: Self-pay | Admitting: *Deleted

## 2019-03-03 DIAGNOSIS — O359XX0 Maternal care for (suspected) fetal abnormality and damage, unspecified, not applicable or unspecified: Secondary | ICD-10-CM | POA: Diagnosis not present

## 2019-03-03 DIAGNOSIS — Z3A19 19 weeks gestation of pregnancy: Secondary | ICD-10-CM

## 2019-03-03 DIAGNOSIS — Z363 Encounter for antenatal screening for malformations: Secondary | ICD-10-CM

## 2019-03-03 DIAGNOSIS — Z362 Encounter for other antenatal screening follow-up: Secondary | ICD-10-CM

## 2019-03-03 DIAGNOSIS — Z3400 Encounter for supervision of normal first pregnancy, unspecified trimester: Secondary | ICD-10-CM | POA: Insufficient documentation

## 2019-03-10 ENCOUNTER — Encounter: Payer: Self-pay | Admitting: Obstetrics

## 2019-03-10 ENCOUNTER — Other Ambulatory Visit: Payer: Self-pay

## 2019-03-10 ENCOUNTER — Telehealth (INDEPENDENT_AMBULATORY_CARE_PROVIDER_SITE_OTHER): Payer: BC Managed Care – PPO | Admitting: Obstetrics

## 2019-03-10 DIAGNOSIS — O210 Mild hyperemesis gravidarum: Secondary | ICD-10-CM

## 2019-03-10 DIAGNOSIS — Z3A20 20 weeks gestation of pregnancy: Secondary | ICD-10-CM

## 2019-03-10 DIAGNOSIS — Z3400 Encounter for supervision of normal first pregnancy, unspecified trimester: Secondary | ICD-10-CM

## 2019-03-10 DIAGNOSIS — O468X1 Other antepartum hemorrhage, first trimester: Secondary | ICD-10-CM

## 2019-03-10 DIAGNOSIS — O418X10 Other specified disorders of amniotic fluid and membranes, first trimester, not applicable or unspecified: Secondary | ICD-10-CM

## 2019-03-10 NOTE — Progress Notes (Signed)
Subjective:  Brittany Rhodes is a 23 y.o. G1P0 at [redacted]w[redacted]d being seen today for ongoing prenatal care.  She is currently monitored for the following issues for this low-risk pregnancy and has Morning sickness; Subchorionic hematoma; and Supervision of normal first pregnancy, antepartum on their problem list.  Patient reports no complaints.  Contractions: Not present. Vag. Bleeding: None.  Movement: Present. Denies leaking of fluid.   The following portions of the patient's history were reviewed and updated as appropriate: allergies, current medications, past family history, past medical history, past social history, past surgical history and problem list. Problem list updated.  Objective:  There were no vitals filed for this visit.  Fetal Status:     Movement: Present     General:  Alert, oriented and cooperative. Patient is in no acute distress.  Skin: Skin is warm and dry. No rash noted.   Cardiovascular: Normal heart rate noted  Respiratory: Normal respiratory effort, no problems with respiration noted  Abdomen: Soft, gravid, appropriate for gestational age. Pain/Pressure: Absent     Pelvic:  Cervical exam deferred        Extremities: Normal range of motion.  Edema: None  Mental Status: Normal mood and affect. Normal behavior. Normal judgment and thought content.   Urinalysis:      Assessment and Plan:  Pregnancy: G1P0 at [redacted]w[redacted]d  1. Supervision of normal first pregnancy, antepartum  2. Morning sickness.  RESOLVED.  3. Subchorionic hematoma in first trimester, single or unspecified fetus - Stable clinically  Preterm labor symptoms and general obstetric precautions including but not limited to vaginal bleeding, contractions, leaking of fluid and fetal movement were reviewed in detail with the patient. Please refer to After Visit Summary for other counseling recommendations.   Return in about 4 weeks (around 04/07/2019) for MyChart.   Shelly Bombard, MD  03/10/2019

## 2019-03-10 NOTE — Progress Notes (Signed)
Pt is on the phone preparing for virtual visit with provider. [redacted]w[redacted]d. Pt is unable to check BP today due to not having batteries for her BP cuff. Pt denies any HA's or blurry vision.

## 2019-03-31 ENCOUNTER — Other Ambulatory Visit: Payer: Self-pay

## 2019-03-31 ENCOUNTER — Ambulatory Visit (HOSPITAL_COMMUNITY)
Admission: RE | Admit: 2019-03-31 | Discharge: 2019-03-31 | Disposition: A | Discharge status: Home / Self Care | Payer: BC Managed Care – PPO | Source: Ambulatory Visit | Attending: Obstetrics and Gynecology | Admitting: Obstetrics and Gynecology

## 2019-03-31 DIAGNOSIS — Z362 Encounter for other antenatal screening follow-up: Secondary | ICD-10-CM | POA: Diagnosis present

## 2019-03-31 DIAGNOSIS — O359XX0 Maternal care for (suspected) fetal abnormality and damage, unspecified, not applicable or unspecified: Secondary | ICD-10-CM | POA: Diagnosis not present

## 2019-03-31 DIAGNOSIS — Z3A23 23 weeks gestation of pregnancy: Secondary | ICD-10-CM | POA: Diagnosis not present

## 2019-04-07 ENCOUNTER — Telehealth (INDEPENDENT_AMBULATORY_CARE_PROVIDER_SITE_OTHER): Payer: BC Managed Care – PPO

## 2019-04-07 ENCOUNTER — Telehealth: Payer: BC Managed Care – PPO

## 2019-04-07 DIAGNOSIS — Z3A24 24 weeks gestation of pregnancy: Secondary | ICD-10-CM

## 2019-04-07 DIAGNOSIS — Z3400 Encounter for supervision of normal first pregnancy, unspecified trimester: Secondary | ICD-10-CM

## 2019-04-07 DIAGNOSIS — O210 Mild hyperemesis gravidarum: Secondary | ICD-10-CM

## 2019-04-07 NOTE — Patient Instructions (Signed)
Glucose Tolerance Test During Pregnancy Why am I having this test? The glucose tolerance test (GTT) is done to check how your body processes sugar (glucose). This is one of several tests used to diagnose diabetes that develops during pregnancy (gestational diabetes mellitus). Gestational diabetes is a temporary form of diabetes that some women develop during pregnancy. It usually occurs during the second trimester of pregnancy and goes away after delivery. Testing (screening) for gestational diabetes usually occurs between 24 and 28 weeks of pregnancy. You may have the GTT test after having a 1-hour glucose screening test if the results from that test indicate that you may have gestational diabetes. You may also have this test if:  You have a history of gestational diabetes.  You have a history of giving birth to very large babies or have experienced repeated fetal loss (stillbirth).  You have signs and symptoms of diabetes, such as: ? Changes in your vision. ? Tingling or numbness in your hands or feet. ? Changes in hunger, thirst, and urination that are not otherwise explained by your pregnancy. What is being tested? This test measures the amount of glucose in your blood at different times during a period of 3 hours. This indicates how well your body is able to process glucose. What kind of sample is taken?  Blood samples are required for this test. They are usually collected by inserting a needle into a blood vessel. How do I prepare for this test?  For 3 days before your test, eat normally. Have plenty of carbohydrate-rich foods.  Follow instructions from your health care provider about: ? Eating or drinking restrictions on the day of the test. You may be asked to not eat or drink anything other than water (fast) starting 8-10 hours before the test. ? Changing or stopping your regular medicines. Some medicines may interfere with this test. Tell a health care provider about:  All  medicines you are taking, including vitamins, herbs, eye drops, creams, and over-the-counter medicines.  Any blood disorders you have.  Any surgeries you have had.  Any medical conditions you have. What happens during the test? First, your blood glucose will be measured. This is referred to as your fasting blood glucose, since you fasted before the test. Then, you will drink a glucose solution that contains a certain amount of glucose. Your blood glucose will be measured again 1, 2, and 3 hours after drinking the solution. This test takes about 3 hours to complete. You will need to stay at the testing location during this time. During the testing period:  Do not eat or drink anything other than the glucose solution.  Do not exercise.  Do not use any products that contain nicotine or tobacco, such as cigarettes and e-cigarettes. If you need help stopping, ask your health care provider. The testing procedure may vary among health care providers and hospitals. How are the results reported? Your results will be reported as milligrams of glucose per deciliter of blood (mg/dL) or millimoles per liter (mmol/L). Your health care provider will compare your results to normal ranges that were established after testing a large group of people (reference ranges). Reference ranges may vary among labs and hospitals. For this test, common reference ranges are:  Fasting: less than 95-105 mg/dL (5.3-5.8 mmol/L).  1 hour after drinking glucose: less than 180-190 mg/dL (10.0-10.5 mmol/L).  2 hours after drinking glucose: less than 155-165 mg/dL (8.6-9.2 mmol/L).  3 hours after drinking glucose: 140-145 mg/dL (7.8-8.1 mmol/L). What do the   results mean? Results within reference ranges are considered normal, meaning that your glucose levels are well-controlled. If two or more of your blood glucose levels are high, you may be diagnosed with gestational diabetes. If only one level is high, your health care  provider may suggest repeat testing or other tests to confirm a diagnosis. Talk with your health care provider about what your results mean. Questions to ask your health care provider Ask your health care provider, or the department that is doing the test:  When will my results be ready?  How will I get my results?  What are my treatment options?  What other tests do I need?  What are my next steps? Summary  The glucose tolerance test (GTT) is one of several tests used to diagnose diabetes that develops during pregnancy (gestational diabetes mellitus). Gestational diabetes is a temporary form of diabetes that some women develop during pregnancy.  You may have the GTT test after having a 1-hour glucose screening test if the results from that test indicate that you may have gestational diabetes. You may also have this test if you have any symptoms or risk factors for gestational diabetes.  Talk with your health care provider about what your results mean. This information is not intended to replace advice given to you by your health care provider. Make sure you discuss any questions you have with your health care provider. Document Released: 10/30/2011 Document Revised: 08/21/2018 Document Reviewed: 12/10/2016 Elsevier Patient Education  2020 [employer]  

## 2019-04-07 NOTE — Progress Notes (Signed)
TELEHEALTH OBSTETRICS PRENATAL VIRTUAL VIDEO VISIT ENCOUNTER NOTE  Provider location: [facility] for [facility] [place]   I connected with Brittany Rhodes on 04/07/19 at  8:45 AM EST by MyChart Video Encounter at home and verified that I am speaking with the correct person using two identifiers.   I discussed the limitations, risks, security and privacy concerns of performing an evaluation and management service virtually and the availability of in person appointments. I also discussed with the patient that there may be a patient responsible charge related to this service. The patient expressed understanding and agreed to proceed. Subjective:  Brittany Rhodes is a 23 y.o. G1P0 at [redacted]w[redacted]d being seen today for ongoing prenatal care.  She is currently monitored for the following issues for this low-risk pregnancy and has Morning sickness; Subchorionic hematoma; and Supervision of normal first pregnancy, antepartum on their problem list.  Patient reports no complaints. She reports that her morning sickness has resolved.  She endorses fetal movement and denies ctx, lof, vb, and vaginal concerns including discharge. She expresses a desire for Nexplanon for PP contraception.  Contractions: Not present. Vag. Bleeding: None.  Movement: Present. Denies any leaking of fluid.   The following portions of the patient's history were reviewed and updated as appropriate: allergies, current medications, past family history, past medical history, past social history, past surgical history and problem list.   Objective:  There were no vitals filed for this visit.  Fetal Status:     Movement: Present     General:  Alert, oriented and cooperative. Patient is in no acute distress.  Respiratory: Normal respiratory effort, no problems with respiration noted  Mental Status: Normal mood and affect. Normal behavior. Normal judgment and thought content.  Rest of physical exam deferred due to type of  encounter  Imaging: [place] Mfm Ob Follow Up  Result Date: 03/31/2019 ----------------------------------------------------------------------  OBSTETRICS REPORT                       (Signed Final 03/31/2019 02:01 pm) ---------------------------------------------------------------------- Patient Info  ID #:       [id number]                          D.O.B.:  [birth year] (23 yrs)  Name:       Brittany Rhodes                    Visit Date: 03/31/2019 11:16 am ---------------------------------------------------------------------- Performed By  Performed By:     Emeline Darling BS,      Ref. Address:     [address]                    [place]                                                             [address]  Attending:        Ma Rings MD         [facility]  Referred By:      [facility] ---------------------------------------------------------------------- Orders   #  Description                          Code         Ordered By   1  [place] MFM OB FOLLOW UP                  16109.60     Rosana Hoes  ----------------------------------------------------------------------   #  Order #                    Accession #                 Episode #   1  [id number]                  [id number]                  [id number]  ---------------------------------------------------------------------- Indications   Fetal abnormality - other known or suspected   O35.9XX0   [redacted] weeks gestation of pregnancy                Z3A.23   Antenatal follow-up for nonvisualized fetal    Z36.2   anatomy   Low Risk NIPS (Negative AFP)  ---------------------------------------------------------------------- Fetal Evaluation  Num Of Fetuses:         1  Fetal Heart Rate(bpm):  142  Cardiac Activity:       Observed  Presentation:           Cephalic  Placenta:               Anterior  P. Cord Insertion:      Previously Visualized  Amniotic Fluid  AFI FV:      Within normal limits                               Largest Pocket(cm)                              7.2 ---------------------------------------------------------------------- Biometry  BPD:      59.2  mm     G. Age:  24w 1d         77  %    CI:        73.09   %    70 - 86                                                          FL/HC:      20.5   %    19.2 - 20.8  HC:      220.1  mm     G. Age:  24w 0d         65  %    HC/AC:      1.11        1.05 - 1.21  AC:      199.1  mm     G. Age:  24w 4d         80  %    FL/BPD:     76.2   %  71 - 87  FL:       45.1  mm     G. Age:  24w 6d         86  %    FL/AC:      22.7   %    20 - 24  Est. FW:     716  gm      1 lb 9 oz     94  % ---------------------------------------------------------------------- OB History  Gravidity:    1         Term:   0        Prem:   0        SAB:   0  TOP:          0       Ectopic:  0        Living: 0 ---------------------------------------------------------------------- Gestational Age  LMP:           24w 1d        Date:  10/13/18                 EDD:   07/20/19  U/S Today:     24w 3d                                        EDD:   07/18/19  Best:          23w 2d     Det. By:  Marcella DubsEarly Ultrasound         EDD:   07/26/19                                      (12/01/18) ---------------------------------------------------------------------- Anatomy  Cranium:               Appears normal         LVOT:                   Appears normal  Cavum:                 Appears normal         Aortic Arch:            Appears normal  Ventricles:            Appears normal         Ductal Arch:            Appears normal  Choroid Plexus:        Previously seen        Diaphragm:              Appears normal  Cerebellum:            Appears normal         Stomach:                Appears normal, left  sided  Posterior Fossa:       Appears normal         Abdomen:                Appears normal  Nuchal Fold:           Not applicable (>26     Abdominal Wall:         Appears nml (cord                         wks GA)                                        insert, abd wall)  Face:                  Appears normal         Cord Vessels:           Previously seen                         (orbits and profile)  Lips:                  Appears normal         Kidneys:                Appear normal  Palate:                Not well visualized    Bladder:                Appears normal  Thoracic:              Appears normal         Spine:                  Appears normal  Heart:                 Appears normal         Upper Extremities:      Previously seen                         (4CH, axis, and                         situs)  RVOT:                  Appears normal         Lower Extremities:      Previously seen  Other:  Fetus appears to be a female. Nasal bone visualized. Heels visualized.          Hands not well visualized. ---------------------------------------------------------------------- Cervix Uterus Adnexa  Cervix  Length:            3.3  cm.  Normal appearance by transabdominal scan. ---------------------------------------------------------------------- Comments  This patient was seen for a follow up exam as the fetal  cardiac views were unable to be fully visualized during her  prior exam.  She denies any problems since her last exam.  She was informed that the fetal growth and amniotic fluid  level appears appropriate for her gestational age.  The views of the fetal heart were visualized today.  There  were no obvious anomalies suspected.  The limitations of  ultrasound in the detection of all anomalies was discussed.  Follow-up as indicated. ----------------------------------------------------------------------                   Ma Rings, MD Electronically Signed Final Report   03/31/2019 02:01 pm ----------------------------------------------------------------------   Assessment and Plan:  Pregnancy: G1P0 at [redacted]w[redacted]d 1. Supervision of normal first  pregnancy, antepartum -Anticipatory guidance for upcoming appts. -Reviewed Glucola appt preparation including fasting the night before and morning of.   -Discussed anticipated office time of 2.5-3 hours.  -Reviewed blood draw procedures and labs which also include check of iron level.  -Discussed how results of GTT are handled including diabetic education and BS testing for abnormal results and routine care for normal results.  -Problem list updated to show desire for Nexplanon -Discussed how placement in hospital is an option if desired.   2. Morning sickness -Resolved  Preterm labor symptoms and general obstetric precautions including but not limited to vaginal bleeding, contractions, leaking of fluid and fetal movement were reviewed in detail with the patient. I discussed the assessment and treatment plan with the patient. The patient was provided an opportunity to ask questions and all were answered. The patient agreed with the plan and demonstrated an understanding of the instructions. The patient was advised to call back or seek an in-person office evaluation/go to [facility] at [facility] for any urgent or concerning symptoms. Please refer to After Visit Summary for other counseling recommendations.   I provided 7 minutes of face-to-face time during this encounter.  Return in about 4 weeks (around 05/05/2019) for LR-ROB with GTT.  No future appointments.  Cherre Robins, CNM [facility] [employer], [facility]

## 2019-04-07 NOTE — Progress Notes (Signed)
Pt is on the phone preparing for virtual visit with provider. [redacted]w[redacted]d. Pt was unable to check BP this morning, could not work the cuff by herself. I instructed pt to have someone help her check it later today and to call us if BP is abnormal, pt verbalizes understanding. Pt denies any HA's, blurry vision, or swelling.

## 2019-04-14 DIAGNOSIS — D649 Anemia, unspecified: Secondary | ICD-10-CM

## 2019-04-14 HISTORY — DX: Anemia, unspecified: D64.9

## 2019-04-27 ENCOUNTER — Encounter: Payer: Self-pay | Admitting: Obstetrics

## 2019-04-27 ENCOUNTER — Encounter: Payer: Self-pay | Admitting: Advanced Practice Midwife

## 2019-05-05 ENCOUNTER — Other Ambulatory Visit: Payer: BC Managed Care – PPO

## 2019-05-05 ENCOUNTER — Other Ambulatory Visit: Payer: Self-pay

## 2019-05-05 ENCOUNTER — Other Ambulatory Visit (HOSPITAL_COMMUNITY)
Admission: RE | Admit: 2019-05-05 | Discharge: 2019-05-05 | Disposition: A | Discharge status: Home / Self Care | Payer: BC Managed Care – PPO | Source: Ambulatory Visit | Attending: Advanced Practice Midwife | Admitting: Advanced Practice Midwife

## 2019-05-05 ENCOUNTER — Ambulatory Visit (INDEPENDENT_AMBULATORY_CARE_PROVIDER_SITE_OTHER): Payer: BC Managed Care – PPO | Admitting: Advanced Practice Midwife

## 2019-05-05 VITALS — BP 110/75 | HR 96 | Wt 194.0 lb

## 2019-05-05 DIAGNOSIS — O26893 Other specified pregnancy related conditions, third trimester: Secondary | ICD-10-CM

## 2019-05-05 DIAGNOSIS — N898 Other specified noninflammatory disorders of vagina: Secondary | ICD-10-CM

## 2019-05-05 DIAGNOSIS — Z3A28 28 weeks gestation of pregnancy: Secondary | ICD-10-CM

## 2019-05-05 DIAGNOSIS — Z3400 Encounter for supervision of normal first pregnancy, unspecified trimester: Secondary | ICD-10-CM

## 2019-05-05 NOTE — Progress Notes (Signed)
   PRENATAL VISIT NOTE  Subjective:  Brittany Rhodes is a 23 y.o. G1P0 at [redacted]w[redacted]d being seen today for ongoing prenatal care.  She is currently monitored for the following issues for this low-risk pregnancy and has Morning sickness and Supervision of normal first pregnancy, antepartum on their problem list.  Patient reports green vaginal discharge last week, none now, no itching/burning or odor.  Contractions: Not present. Vag. Bleeding: None.  Movement: Present. Denies leaking of fluid.   The following portions of the patient's history were reviewed and updated as appropriate: allergies, current medications, past family history, past medical history, past social history, past surgical history and problem list.   Objective:   Vitals:   05/05/19 0933  BP: 110/75  Pulse: 96  Weight: 194 lb (88 kg)    Fetal Status: Fetal Heart Rate (bpm): 138   Movement: Present     General:  Alert, oriented and cooperative. Patient is in no acute distress.  Skin: Skin is warm and dry. No rash noted.   Cardiovascular: Normal heart rate noted  Respiratory: Normal respiratory effort, no problems with respiration noted  Abdomen: Soft, gravid, appropriate for gestational age.  Pain/Pressure: Present     Pelvic: Cervical exam deferred        Extremities: Normal range of motion.  Edema: None  Mental Status: Normal mood and affect. Normal behavior. Normal judgment and thought content.   Assessment and Plan:  Pregnancy: G1P0 at [redacted]w[redacted]d 1. Supervision of normal first pregnancy, antepartum --Pt was not aware she needed to be fasting for 2 hour glucose test.   --She is fairly low risk with no family hx of DM, BMI is 30.  Recommend she return for 2 hour but her work schedule makes that difficult.   --Will do 1 hour today, pt aware that 3 hour is needed if 1 hour is elevated --Anticipatory guidance about next visits/weeks of pregnancy given.  - Glucose tolerance test, 1 hour - CBC - HIV antibody - RPR  2. Vaginal  discharge during pregnancy in third trimester  - Cervicovaginal ancillary only( [place])  Preterm labor symptoms and general obstetric precautions including but not limited to vaginal bleeding, contractions, leaking of fluid and fetal movement were reviewed in detail with the patient. Please refer to After Visit Summary for other counseling recommendations.   Return in about 4 weeks (around 06/02/2019).  No future appointments.  Fatima Blank, CNM

## 2019-05-05 NOTE — Patient Instructions (Signed)
Third Trimester of Pregnancy The third trimester is from week 28 through week 40 (months 7 through 9). The third trimester is a time when the unborn baby (fetus) is growing rapidly. At the end of the ninth month, the fetus is about 20 inches in length and weighs 6-10 pounds. Body changes during your third trimester Your body will continue to go through many changes during pregnancy. The changes vary from woman to woman. During the third trimester:  Your weight will continue to increase. You can expect to gain 25-35 pounds (11-16 kg) by the end of the pregnancy.  You may begin to get stretch marks on your hips, abdomen, and breasts.  You may urinate more often because the fetus is moving lower into your pelvis and pressing on your bladder.  You may develop or continue to have heartburn. This is caused by increased hormones that slow down muscles in the digestive tract.  You may develop or continue to have constipation because increased hormones slow digestion and cause the muscles that push waste through your intestines to relax.  You may develop hemorrhoids. These are swollen veins (varicose veins) in the rectum that can itch or be painful.  You may develop swollen, bulging veins (varicose veins) in your legs.  You may have increased body aches in the pelvis, back, or thighs. This is due to weight gain and increased hormones that are relaxing your joints.  You may have changes in your hair. These can include thickening of your hair, rapid growth, and changes in texture. Some women also have hair loss during or after pregnancy, or hair that feels dry or thin. Your hair will most likely return to normal after your baby is born.  Your breasts will continue to grow and they will continue to become tender. A yellow fluid (colostrum) may leak from your breasts. This is the first milk you are producing for your baby.  Your belly button may stick out.  You may notice more swelling in your hands,  face, or ankles.  You may have increased tingling or numbness in your hands, arms, and legs. The skin on your belly may also feel numb.  You may feel short of breath because of your expanding uterus.  You may have more problems sleeping. This can be caused by the size of your belly, increased need to urinate, and an increase in your body's metabolism.  You may notice the fetus "dropping," or moving lower in your abdomen (lightening).  You may have increased vaginal discharge.  You may notice your joints feel loose and you may have pain around your pelvic bone. What to expect at prenatal visits You will have prenatal exams every 2 weeks until week 36. Then you will have weekly prenatal exams. During a routine prenatal visit:  You will be weighed to make sure you and the baby are growing normally.  Your blood pressure will be taken.  Your abdomen will be measured to track your baby's growth.  The fetal heartbeat will be listened to.  Any test results from the previous visit will be discussed.  You may have a cervical check near your due date to see if your cervix has softened or thinned (effaced).  You will be tested for Group B streptococcus. This happens between 35 and 37 weeks. Your health care provider may ask you:  What your birth plan is.  How you are feeling.  If you are feeling the baby move.  If you have had any abnormal   symptoms, such as leaking fluid, bleeding, severe headaches, or abdominal cramping.  If you are using any tobacco products, including cigarettes, chewing tobacco, and electronic cigarettes.  If you have any questions. Other tests or screenings that may be performed during your third trimester include:  Blood tests that check for low iron levels (anemia).  Fetal testing to check the health, activity level, and growth of the fetus. Testing is done if you have certain medical conditions or if there are problems during the pregnancy.  Nonstress test  (NST). This test checks the health of your baby to make sure there are no signs of problems, such as the baby not getting enough oxygen. During this test, a belt is placed around your belly. The baby is made to move, and its heart rate is monitored during movement. What is false labor? False labor is a condition in which you feel small, irregular tightenings of the muscles in the womb (contractions) that usually go away with rest, changing position, or drinking water. These are called Braxton Hicks contractions. Contractions may last for hours, days, or even weeks before true labor sets in. If contractions come at regular intervals, become more frequent, increase in intensity, or become painful, you should see your health care provider. What are the signs of labor?  Abdominal cramps.  Regular contractions that start at 10 minutes apart and become stronger and more frequent with time.  Contractions that start on the top of the uterus and spread down to the lower abdomen and back.  Increased pelvic pressure and dull back pain.  A watery or bloody mucus discharge that comes from the vagina.  Leaking of amniotic fluid. This is also known as your "water breaking." It could be a slow trickle or a gush. Let your health care provider know if it has a color or strange odor. If you have any of these signs, call your health care provider right away, even if it is before your due date. Follow these instructions at home: Medicines  Follow your health care provider's instructions regarding medicine use. Specific medicines may be either safe or unsafe to take during pregnancy.  Take a prenatal vitamin that contains at least 600 micrograms (mcg) of folic acid.  If you develop constipation, try taking a stool softener if your health care provider approves. Eating and drinking   Eat a balanced diet that includes fresh fruits and vegetables, whole grains, good sources of protein such as meat, eggs, or tofu,  and low-fat dairy. Your health care provider will help you determine the amount of weight gain that is right for you.  Avoid raw meat and uncooked cheese. These carry germs that can cause birth defects in the baby.  If you have low calcium intake from food, talk to your health care provider about whether you should take a daily calcium supplement.  Eat four or five small meals rather than three large meals a day.  Limit foods that are high in fat and processed sugars, such as fried and sweet foods.  To prevent constipation: ? Drink enough fluid to keep your urine clear or pale yellow. ? Eat foods that are high in fiber, such as fresh fruits and vegetables, whole grains, and beans. Activity  Exercise only as directed by your health care provider. Most women can continue their usual exercise routine during pregnancy. Try to exercise for 30 minutes at least 5 days a week. Stop exercising if you experience uterine contractions.  Avoid heavy lifting.  Do   not exercise in extreme heat or humidity, or at high altitudes.  Wear low-heel, comfortable shoes.  Practice good posture.  You may continue to have sex unless your health care provider tells you otherwise. Relieving pain and discomfort  Take frequent breaks and rest with your legs elevated if you have leg cramps or low back pain.  Take warm sitz baths to soothe any pain or discomfort caused by hemorrhoids. Use hemorrhoid cream if your health care provider approves.  Wear a good support bra to prevent discomfort from breast tenderness.  If you develop varicose veins: ? Wear support pantyhose or compression stockings as told by your healthcare provider. ? Elevate your feet for 15 minutes, 3-4 times a day. Prenatal care  Write down your questions. Take them to your prenatal visits.  Keep all your prenatal visits as told by your health care provider. This is important. Safety  Wear your seat belt at all times when driving.  Make  a list of emergency phone numbers, including numbers for family, friends, the hospital, and police and fire departments. General instructions  Avoid cat litter boxes and soil used by cats. These carry germs that can cause birth defects in the baby. If you have a cat, ask someone to clean the litter box for you.  Do not travel far distances unless it is absolutely necessary and only with the approval of your health care provider.  Do not use hot tubs, steam rooms, or saunas.  Do not drink alcohol.  Do not use any products that contain nicotine or tobacco, such as cigarettes and e-cigarettes. If you need help quitting, ask your health care provider.  Do not use any medicinal herbs or unprescribed drugs. These chemicals affect the formation and growth of the baby.  Do not douche or use tampons or scented sanitary pads.  Do not cross your legs for long periods of time.  To prepare for the arrival of your baby: ? Take prenatal classes to understand, practice, and ask questions about labor and delivery. ? Make a trial run to the hospital. ? Visit the hospital and tour the maternity area. ? Arrange for maternity or paternity leave through employers. ? Arrange for family and friends to take care of pets while you are in the hospital. ? Purchase a rear-facing car seat and make sure you know how to install it in your car. ? Pack your hospital bag. ? Prepare the baby's nursery. Make sure to remove all pillows and stuffed animals from the baby's crib to prevent suffocation.  Visit your dentist if you have not gone during your pregnancy. Use a soft toothbrush to brush your teeth and be gentle when you floss. Contact a health care provider if:  You are unsure if you are in labor or if your water has broken.  You become dizzy.  You have mild pelvic cramps, pelvic pressure, or nagging pain in your abdominal area.  You have lower back pain.  You have persistent nausea, vomiting, or diarrhea.   You have an unusual or bad smelling vaginal discharge.  You have pain when you urinate. Get help right away if:  Your water breaks before 37 weeks.  You have regular contractions less than 5 minutes apart before 37 weeks.  You have a fever.  You are leaking fluid from your vagina.  You have spotting or bleeding from your vagina.  You have severe abdominal pain or cramping.  You have rapid weight loss or weight gain.  You have   shortness of breath with chest pain.  You notice sudden or extreme swelling of your face, hands, ankles, feet, or legs.  Your baby makes fewer than 10 movements in 2 hours.  You have severe headaches that do not go away when you take medicine.  You have vision changes. Summary  The third trimester is from week 28 through week 40, months 7 through 9. The third trimester is a time when the unborn baby (fetus) is growing rapidly.  During the third trimester, your discomfort may increase as you and your baby continue to gain weight. You may have abdominal, leg, and back pain, sleeping problems, and an increased need to urinate.  During the third trimester your breasts will keep growing and they will continue to become tender. A yellow fluid (colostrum) may leak from your breasts. This is the first milk you are producing for your baby.  False labor is a condition in which you feel small, irregular tightenings of the muscles in the womb (contractions) that eventually go away. These are called Braxton Hicks contractions. Contractions may last for hours, days, or even weeks before true labor sets in.  Signs of labor can include: abdominal cramps; regular contractions that start at 10 minutes apart and become stronger and more frequent with time; watery or bloody mucus discharge that comes from the vagina; increased pelvic pressure and dull back pain; and leaking of amniotic fluid. This information is not intended to replace advice given to you by your health  care provider. Make sure you discuss any questions you have with your health care provider. Document Released: 04/24/2001 Document Revised: 08/21/2018 Document Reviewed: 06/05/2016 [employer]vier Patient Education  2020 [employer]  

## 2019-05-06 LAB — CBC
Hematocrit: 27.6 % — ABNORMAL LOW (ref 34.0–46.6)
Hemoglobin: 8.8 g/dL — ABNORMAL LOW (ref 11.1–15.9)
MCH: 26.7 pg (ref 26.6–33.0)
MCHC: 31.9 g/dL (ref 31.5–35.7)
MCV: 84 fL (ref 79–97)
Platelets: 284 10*3/uL (ref 150–450)
RBC: 3.3 x10E6/uL — ABNORMAL LOW (ref 3.77–5.28)
RDW: 13.2 % (ref 11.7–15.4)
WBC: 6.8 10*3/uL (ref 3.4–10.8)

## 2019-05-06 LAB — CERVICOVAGINAL ANCILLARY ONLY
Bacterial Vaginitis (gardnerella): POSITIVE — AB
Candida Glabrata: NEGATIVE
Candida Vaginitis: POSITIVE — AB
Chlamydia: NEGATIVE
Comment: NEGATIVE
Comment: NEGATIVE
Comment: NEGATIVE
Comment: NEGATIVE
Comment: NEGATIVE
Comment: NORMAL
Neisseria Gonorrhea: NEGATIVE
Trichomonas: NEGATIVE

## 2019-05-06 LAB — GLUCOSE TOLERANCE, 1 HOUR: Glucose, 1Hr PP: 136 mg/dL (ref 65–199)

## 2019-05-06 LAB — HIV ANTIBODY (ROUTINE TESTING W REFLEX): HIV Screen 4th Generation wRfx: NONREACTIVE

## 2019-05-06 LAB — RPR: RPR Ser Ql: NONREACTIVE

## 2019-05-11 ENCOUNTER — Other Ambulatory Visit: Payer: Self-pay | Admitting: Advanced Practice Midwife

## 2019-05-11 DIAGNOSIS — B373 Candidiasis of vulva and vagina: Secondary | ICD-10-CM

## 2019-05-11 DIAGNOSIS — B9689 Other specified bacterial agents as the cause of diseases classified elsewhere: Secondary | ICD-10-CM

## 2019-05-11 DIAGNOSIS — O99013 Anemia complicating pregnancy, third trimester: Secondary | ICD-10-CM

## 2019-05-11 DIAGNOSIS — B3731 Acute candidiasis of vulva and vagina: Secondary | ICD-10-CM

## 2019-05-11 MED ORDER — FERROUS SULFATE 325 (65 FE) MG PO TABS: 325.0000 mg | ORAL_TABLET | Freq: Three times a day (TID) | ORAL | Status: DC

## 2019-05-11 MED ORDER — TERCONAZOLE 0.8 % VA CREA: 1.0000 | TOPICAL_CREAM | Freq: Every day | VAGINAL | 0 refills | Status: AC

## 2019-05-11 MED ORDER — METRONIDAZOLE 500 MG PO TABS: 500.0000 mg | ORAL_TABLET | Freq: Two times a day (BID) | ORAL | 0 refills | Status: DC

## 2019-05-15 NOTE — L&D Delivery Note (Signed)
OB/GYN Faculty Practice Delivery Note  Brittany Rhodes is a 24 y.o. G1P0 s/p VD at [redacted]w[redacted]d. She was admitted for IOL for NRNST and DFM.   ROM: 1h 23m with clear fluid GBS Status: Negative/-- (02/16 0450) Maximum Maternal Temperature: 98.55F  Labor Progress: . Initial SVE: 1.5/50/-3. Patient received 2 Cytotec and Foley balloon. Received epidural. AROM performed. She then progressed to complete.   Delivery Date/Time: 3/16 @ 0340 Delivery: [name] to room for repetitive variables and patient found to be complete and we began pushing. Head delivered in ROA position. Nuchal cord present and reduced. Shoulder and body delivered in usual fashion. Infant with spontaneous cry, placed on mother's abdomen, dried and stimulated. Cord clamped x 2 after 1-minute delay, and cut by [name]. Cord blood drawn. Placenta delivered spontaneously with gentle cord traction. Fundus firm with massage and Pitocin. Labia, perineum, vagina, and cervix inspected inspected with 2nd degree perineal and bilateral periurethral lacerations which were repaired with 3-0 Vicryl and 4-0 Vicryl respectively in a standard fashion.  Baby Weight: pending  Placenta: Sent to L&D Complications: None Lacerations: 2nd degree perineal and bilateral periurethral  EBL: 675 mL Analgesia: Epidural   Infant:  APGAR (1 MIN): 8   APGAR (5 MINS): 9   APGAR (10 MINS):     Jerilynn Birkenhead, MD OB Family Medicine Fellow, [facility] for [facility], [facility] 07/28/2019, 4:05 AM

## 2019-05-28 ENCOUNTER — Encounter: Payer: BC Managed Care – PPO | Attending: Advanced Practice Midwife | Admitting: Dietician

## 2019-05-28 ENCOUNTER — Encounter: Payer: Self-pay | Admitting: Dietician

## 2019-05-28 DIAGNOSIS — O99013 Anemia complicating pregnancy, third trimester: Secondary | ICD-10-CM | POA: Diagnosis not present

## 2019-05-28 NOTE — Patient Instructions (Signed)
   Incorporate breakfast every day within 2 hours of waking up, such as a protein shake.   Obtain iron prescription and take as directed (3x/day) preferably with food.   Focus on eating sources of iron (ex: red meat, fish, beans, leafy greens) and try having sources of vitamin C at the same time for better absorption (ex: OJ, strawberries)

## 2019-05-28 NOTE — Progress Notes (Signed)
Medical Nutrition Therapy  Appt Start Time: 9:00am   End Time: 9:30am  Primary concerns today: anemia   Referral diagnosis: O99.013- anemia affecting pregnancy in third trimester Preferred learning style: no preference indicated Learning readiness: ready   NUTRITION ASSESSMENT   Lifestyle & Dietary Hx Typical meal pattern is 2 meals per day. States she has been eating broccoli about every other day to try and incorporate more iron. 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 May have a cheeseburger or alfredo or chicken salad. Typically does not snack throughout the day.   Supplements: prenatal (has not started prescribed iron supplement yet)   24-Hr Dietary Recall First Meal: - Snack: - Second Meal: chicken + rice  Snack: - Third Meal: fast food/ eat out  Snack: - Beverages: water, sweet tea, soda    NUTRITION DIAGNOSIS  Increased nutrient (iron) needs (NI-5.1) related to iron deficiency anemia as evidenced by low hemoglobin (8.8g/dL) and hematocrit (03.7%) labs as well as prescription for ferrous sulfate.    NUTRITION INTERVENTION  Nutrition education (E-1) on the following topics:  . Pregnancy Nutrition  . Iron Deficiency Anemia   Handouts Provided Include   Pregnancy Nutrition  Learning Style & Readiness for Change Teaching method utilized: Visual & Auditory  Demonstrated degree of understanding via: Teach Back  Barriers to learning/adherence to lifestyle change: None Identified   Goals Established by Pt . Incorporate breakfast every day within 2 hours of waking up, such as a protein shake.  . Obtain iron prescription and take as directed (3x/day) preferably with food.  . Focus on eating sources of iron (ex: red meat, fish, beans, leafy greens) and try having sources of vitamin C at the same time for better absorption (ex: OJ, strawberries)    MONITORING & EVALUATION Dietary intake, weekly physical  activity, and goals prn.  Next Steps  Patient is to contact [facility] to schedule follow up visit as needed/desired. Patient may contact via email or phone in the meantime.

## 2019-06-02 ENCOUNTER — Telehealth (INDEPENDENT_AMBULATORY_CARE_PROVIDER_SITE_OTHER): Payer: BC Managed Care – PPO | Admitting: Advanced Practice Midwife

## 2019-06-02 ENCOUNTER — Encounter: Payer: BC Managed Care – PPO | Admitting: Advanced Practice Midwife

## 2019-06-02 ENCOUNTER — Other Ambulatory Visit: Payer: Self-pay

## 2019-06-02 DIAGNOSIS — Z3A32 32 weeks gestation of pregnancy: Secondary | ICD-10-CM

## 2019-06-02 DIAGNOSIS — O99013 Anemia complicating pregnancy, third trimester: Secondary | ICD-10-CM

## 2019-06-02 DIAGNOSIS — M549 Dorsalgia, unspecified: Secondary | ICD-10-CM

## 2019-06-02 DIAGNOSIS — O99891 Other specified diseases and conditions complicating pregnancy: Secondary | ICD-10-CM

## 2019-06-02 DIAGNOSIS — Z3400 Encounter for supervision of normal first pregnancy, unspecified trimester: Secondary | ICD-10-CM

## 2019-06-02 MED ORDER — COMFORT FIT MATERNITY SUPP MED MISC: 1.0000 | Freq: Every day | 0 refills | Status: DC

## 2019-06-02 MED ORDER — INTEGRA 62.5-62.5-40-3 MG PO CAPS: 1.0000 | ORAL_CAPSULE | Freq: Every day | ORAL | 2 refills | Status: DC

## 2019-06-02 NOTE — Progress Notes (Addendum)
I connected with  Brittany Rhodes on 06/02/19 by a video enabled telemedicine application and verified that I am speaking with the correct person using two identifiers.   I discussed the limitations of evaluation and management by telemedicine. The patient expressed understanding and agreed to proceed.  MyChart OB.  C/o back pain and pressure 5/10 x 2 days.  Patient stated that she misplaced her BP Cuff and is unable to check her BP.

## 2019-06-02 NOTE — Progress Notes (Signed)
TELEHEALTH OBSTETRICS PRENATAL VIRTUAL VIDEO VISIT ENCOUNTER NOTE  Provider location: Center for [facility] at [place]   I connected with Brittany Rhodes on 06/02/19 at  8:15 AM EST by MyChart Video Encounter at home and verified that I am speaking with the correct person using two identifiers.   I discussed the limitations, risks, security and privacy concerns of performing an evaluation and management service virtually and the availability of in person appointments. I also discussed with the patient that there may be a patient responsible charge related to this service. The patient expressed understanding and agreed to proceed. Subjective:  Brittany Rhodes is a 24 y.o. G1P0 at [redacted]w[redacted]d being seen today for ongoing prenatal care.  She is currently monitored for the following issues for this low-risk pregnancy and has Morning sickness and Supervision of normal first pregnancy, antepartum on their problem list.  Patient reports backache.  Contractions: Irritability. Vag. Bleeding: None.  Movement: Present. Denies any leaking of fluid.   The following portions of the patient's history were reviewed and updated as appropriate: allergies, current medications, past family history, past medical history, past social history, past surgical history and problem list.   Objective:  There were no vitals filed for this visit.  Fetal Status:     Movement: Present     General:  Alert, oriented and cooperative. Patient is in no acute distress.  Respiratory: Normal respiratory effort, no problems with respiration noted  Mental Status: Normal mood and affect. Normal behavior. Normal judgment and thought content.  Rest of physical exam deferred due to type of encounter  Imaging: No results found.  Assessment and Plan:  Pregnancy: G1P0 at [redacted]w[redacted]d 1. Supervision of normal first pregnancy, antepartum --Pt reports good fetal movement, denies cramping, LOF, or vaginal bleeding --Pt can't find her cuff, but  usually has it.  Will take BP this week and enter into Babyscripts. --Anticipatory guidance about next visits/weeks of pregnancy given. --Next visit at 36 weeks in office for GBS  2. Back pain affecting pregnancy in third trimester --Pain at work and with movement.  Also some pain that wraps around to mid abdomen also associated with movement.  No regular cramping/contractions. --Rest/ice/heat/warm bath/Tylenol/pregnancy support belt - Elastic Bandages & Supports (COMFORT FIT MATERNITY SUPP MED) MISC; 1 Device by Does not apply route daily.  Dispense: 1 each; Refill: 0  4. Anemia affecting pregnancy in third trimester --Hgb 8.8 at 28 week labs. Ferrous sulfate Rx in but error in order so pt unable to get Rx.  New Rx for Integra daily today if pt insurance will cover. Pt to notify me/the office if med is not covered and will switch to ferrous sulfate. - Fe Fum-FePoly-Vit C-Vit B3 (INTEGRA) 62.5-62.5-40-3 MG CAPS; Take 1 capsule by mouth daily.  Dispense: 30 capsule; Refill: 2  Preterm labor symptoms and general obstetric precautions including but not limited to vaginal bleeding, contractions, leaking of fluid and fetal movement were reviewed in detail with the patient. I discussed the assessment and treatment plan with the patient. The patient was provided an opportunity to ask questions and all were answered. The patient agreed with the plan and demonstrated an understanding of the instructions. The patient was advised to call back or seek an in-person office evaluation/go to [facility] at [facility] for any urgent or concerning symptoms. Please refer to After Visit Summary for other counseling recommendations.   I provided 10 minutes of face-to-face time during this encounter.  No follow-ups on file.  No  future appointments.  Brittany Rhodes, [place] for [employer], [place]

## 2019-06-10 ENCOUNTER — Telehealth: Payer: Self-pay | Admitting: Advanced Practice Midwife

## 2019-06-11 ENCOUNTER — Ambulatory Visit: Payer: BC Managed Care – PPO

## 2019-06-30 ENCOUNTER — Encounter: Payer: Self-pay | Admitting: Advanced Practice Midwife

## 2019-06-30 ENCOUNTER — Ambulatory Visit (INDEPENDENT_AMBULATORY_CARE_PROVIDER_SITE_OTHER): Payer: BC Managed Care – PPO | Admitting: Advanced Practice Midwife

## 2019-06-30 ENCOUNTER — Other Ambulatory Visit (HOSPITAL_COMMUNITY)
Admission: RE | Admit: 2019-06-30 | Discharge: 2019-06-30 | Disposition: A | Discharge status: Home / Self Care | Payer: BC Managed Care – PPO | Source: Ambulatory Visit | Attending: Advanced Practice Midwife | Admitting: Advanced Practice Midwife

## 2019-06-30 ENCOUNTER — Other Ambulatory Visit: Payer: Self-pay

## 2019-06-30 VITALS — BP 123/79 | HR 96 | Temp 98.3°F | Wt 204.8 lb

## 2019-06-30 DIAGNOSIS — Z3483 Encounter for supervision of other normal pregnancy, third trimester: Secondary | ICD-10-CM

## 2019-06-30 DIAGNOSIS — Z3A36 36 weeks gestation of pregnancy: Secondary | ICD-10-CM

## 2019-06-30 DIAGNOSIS — O99013 Anemia complicating pregnancy, third trimester: Secondary | ICD-10-CM

## 2019-06-30 DIAGNOSIS — M549 Dorsalgia, unspecified: Secondary | ICD-10-CM

## 2019-06-30 DIAGNOSIS — O99891 Other specified diseases and conditions complicating pregnancy: Secondary | ICD-10-CM

## 2019-06-30 NOTE — Progress Notes (Signed)
   PRENATAL VISIT NOTE  Subjective:  Brittany Rhodes is a 24 y.o. G1P0 at [redacted]w[redacted]d being seen today for ongoing prenatal care.  She is currently monitored for the following issues for this low-risk pregnancy and has Morning sickness and Supervision of normal first pregnancy, antepartum on their problem list.  Patient reports no complaints.  Contractions: Irritability. Vag. Bleeding: None.  Movement: Present. Denies leaking of fluid.   The following portions of the patient's history were reviewed and updated as appropriate: allergies, current medications, past family history, past medical history, past social history, past surgical history and problem list.   Objective:   Vitals:   06/30/19 1611  BP: 123/79  Pulse: 96  Temp: 98.3 F (36.8 C)  Weight: 204 lb 12.8 oz (92.9 kg)    Fetal Status: Fetal Heart Rate (bpm): 148 Fundal Height: 36 cm Movement: Present  Presentation: Vertex  General:  Alert, oriented and cooperative. Patient is in no acute distress.  Skin: Skin is warm and dry. No rash noted.   Cardiovascular: Normal heart rate noted  Respiratory: Normal respiratory effort, no problems with respiration noted  Abdomen: Soft, gravid, appropriate for gestational age.  Pain/Pressure: Present     Pelvic: Cervical exam performed Dilation: Fingertip Effacement (%): 0 Station: -3  Extremities: Normal range of motion.  Edema: Trace  Mental Status: Normal mood and affect. Normal behavior. Normal judgment and thought content.   Assessment and Plan:  Pregnancy: G1P0 at [redacted]w[redacted]d  1. Encounter for supervision of other normal pregnancy in third trimester --Anticipatory guidance about next visits/weeks of pregnancy given.  --Pt has lost BP cuff. Next visit in 2 weeks in office.  --Discussed options to prevent going overdue at pt request including the [employer], EPO, raspberry leaf tea, intercourse. --Answered pt questions about due date and circumcision  - Cervicovaginal ancillary only( [facility]) - Culture, beta strep (group b only)  2. Back pain affecting pregnancy in third trimester --Improved since last visit  3. Anemia affecting pregnancy in third trimester --Taking daily Integra, no s/sx of anemia  Preterm labor symptoms and general obstetric precautions including but not limited to vaginal bleeding, contractions, leaking of fluid and fetal movement were reviewed in detail with the patient. Please refer to After Visit Summary for other counseling recommendations.   Return in about 2 weeks (around 07/14/2019).  Future Appointments  Date Time Provider Department Center  07/14/2019  4:00 PM [place], Wilmer Floor, CNM [facility] None    Sharen Counter, CNM

## 2019-06-30 NOTE — Patient Instructions (Signed)
Things to Try After 37 weeks to Encourage Labor/Get Ready for Labor:   1.  Try the Miles Circuit at www.milescircuit.com daily to improve baby's position and encourage the onset of labor.  2. Walk a little and rest a little every day.  Change positions often.  3. Cervical Ripening: May try one or both a. Red Raspberry Leaf capsules or tea:  two 300mg or 400mg tablets with each meal, 2-3 times a day, or 1-3 cups of tea daily  Potential Side Effects Of Raspberry Leaf:  Most women do not experience any side effects from drinking raspberry leaf tea. However, nausea and loose stools are possible   b. Evening Primrose Oil capsules: may take 1 to 3 capsules daily. May also prick one to release the oil and insert it into your vagina at night.  Some of the potential side effects:  Upset stomach  Loose stools or diarrhea  Headaches  Nausea  4. Sex (and especially sex with orgasm) can also help the cervix ripen and encourage labor onset.   

## 2019-06-30 NOTE — Progress Notes (Signed)
Pt presents for ROB/GBS/GC/CT. 

## 2019-07-02 LAB — CERVICOVAGINAL ANCILLARY ONLY
Chlamydia: NEGATIVE
Comment: NEGATIVE
Comment: NORMAL
Neisseria Gonorrhea: NEGATIVE

## 2019-07-04 LAB — CULTURE, BETA STREP (GROUP B ONLY): Strep Gp B Culture: NEGATIVE

## 2019-07-14 ENCOUNTER — Ambulatory Visit (INDEPENDENT_AMBULATORY_CARE_PROVIDER_SITE_OTHER): Payer: BC Managed Care – PPO | Admitting: Advanced Practice Midwife

## 2019-07-14 ENCOUNTER — Other Ambulatory Visit: Payer: Self-pay

## 2019-07-14 VITALS — BP 112/70 | HR 96 | Wt 203.0 lb

## 2019-07-14 DIAGNOSIS — Z3A38 38 weeks gestation of pregnancy: Secondary | ICD-10-CM

## 2019-07-14 DIAGNOSIS — R21 Rash and other nonspecific skin eruption: Secondary | ICD-10-CM

## 2019-07-14 DIAGNOSIS — Z3483 Encounter for supervision of other normal pregnancy, third trimester: Secondary | ICD-10-CM

## 2019-07-14 MED ORDER — NYSTATIN 100000 UNIT/GM EX CREA: 1.0000 "application " | TOPICAL_CREAM | Freq: Two times a day (BID) | CUTANEOUS | 1 refills | Status: AC

## 2019-07-14 MED ORDER — CLOBETASOL PROPIONATE 0.05 % EX OINT: 1.0000 "application " | TOPICAL_OINTMENT | Freq: Two times a day (BID) | CUTANEOUS | 1 refills | Status: AC

## 2019-07-14 NOTE — Progress Notes (Signed)
   PRENATAL VISIT NOTE  Subjective:  Brittany Rhodes is a 24 y.o. G1P0 at [redacted]w[redacted]d being seen today for ongoing prenatal care.  She is currently monitored for the following issues for this low-risk pregnancy and has Morning sickness and Supervision of normal first pregnancy, antepartum on their problem list.  Patient reports rash on her shoulder/neck.  Contractions: Irregular. Vag. Bleeding: None.  Movement: Present. Denies leaking of fluid.   The following portions of the patient's history were reviewed and updated as appropriate: allergies, current medications, past family history, past medical history, past social history, past surgical history and problem list.   Objective:   Vitals:   07/14/19 1613  BP: 112/70  Pulse: 96  Weight: 203 lb (92.1 kg)    Fetal Status:     Movement: Present     General:  Alert, oriented and cooperative. Patient is in no acute distress.  Skin: Skin is warm and dry. No rash noted.   Cardiovascular: Normal heart rate noted  Respiratory: Normal respiratory effort, no problems with respiration noted  Abdomen: Soft, gravid, appropriate for gestational age.  Pain/Pressure: Present     Pelvic: Cervical exam deferred        Extremities: Normal range of motion.  Edema: Trace  Mental Status: Normal mood and affect. Normal behavior. Normal judgment and thought content.   Assessment and Plan:  Pregnancy: G1P0 at [redacted]w[redacted]d  1. Encounter for supervision of other normal pregnancy in third trimester --Anticipatory guidance about next visits/weeks of pregnancy given. --Next visit in 1 week in the office  2. Rash and nonspecific skin eruption --macular rash, some flaking at surface, pruritic - clobetasol ointment (TEMOVATE) 0.05 %; Apply 1 application topically 2 (two) times daily for 7 days.  Dispense: 30 g; Refill: 1 - nystatin cream (MYCOSTATIN); Apply 1 application topically 2 (two) times daily for 7 days.  Dispense: 30 g; Refill: 1 Term labor symptoms and general  obstetric precautions including but not limited to vaginal bleeding, contractions, leaking of fluid and fetal movement were reviewed in detail with the patient. Please refer to After Visit Summary for other counseling recommendations.   No follow-ups on file.  No future appointments.  Sharen Counter, CNM

## 2019-07-21 ENCOUNTER — Encounter: Payer: Self-pay | Admitting: Family Medicine

## 2019-07-21 ENCOUNTER — Ambulatory Visit (INDEPENDENT_AMBULATORY_CARE_PROVIDER_SITE_OTHER): Payer: BC Managed Care – PPO | Admitting: Family Medicine

## 2019-07-21 VITALS — BP 123/81 | HR 97 | Wt 208.2 lb

## 2019-07-21 DIAGNOSIS — Z3403 Encounter for supervision of normal first pregnancy, third trimester: Secondary | ICD-10-CM

## 2019-07-21 DIAGNOSIS — Z3A39 39 weeks gestation of pregnancy: Secondary | ICD-10-CM

## 2019-07-21 DIAGNOSIS — Z3400 Encounter for supervision of normal first pregnancy, unspecified trimester: Secondary | ICD-10-CM

## 2019-07-21 NOTE — Progress Notes (Signed)
Subjective:  Brittany Rhodes is a 24 y.o. G1P0 at [redacted]w[redacted]d being seen today for ongoing prenatal care.  She is currently monitored for the following issues for this low-risk pregnancy and has Supervision of normal first pregnancy, antepartum on their problem list.  Patient reports no complaints.  Contractions: Irritability. Vag. Bleeding: None.  Movement: Present. Denies leaking of fluid.   The following portions of the patient's history were reviewed and updated as appropriate: allergies, current medications, past family history, past medical history, past social history, past surgical history and problem list. Problem list updated.  Objective:   Vitals:   07/21/19 1622  BP: 123/81  Pulse: 97  Weight: 208 lb 3.2 oz (94.4 kg)    Fetal Status: Fetal Heart Rate (bpm): 154 Fundal Height: 39 cm Movement: Present     General:  Alert, oriented and cooperative. Patient is in no acute distress.  Skin: Skin is warm and dry. No rash noted.   Cardiovascular: Normal heart rate noted  Respiratory: Normal respiratory effort, no problems with respiration noted  Abdomen: Soft, gravid, appropriate for gestational age. Pain/Pressure: Present     Pelvic: Vag. Bleeding: None     Cervical exam performed      FT/thick/-3  Extremities: Normal range of motion.  Edema: Trace  Mental Status: Normal mood and affect. Normal behavior. Normal judgment and thought content.     Assessment and Plan:  Pregnancy: G1P0 at [redacted]w[redacted]d  1. Supervision of normal first pregnancy, antepartum - Discussed preparations for labor including natural ways to induce labor including sexual intercourse, red raspberry leaf tea, and using the [facility] circuit for optimal fetal positioning - Continue routine prenatal care  Term labor symptoms and general obstetric precautions including but not limited to vaginal bleeding, contractions, leaking of fluid and fetal movement were reviewed in detail with the patient. Please refer to After Visit Summary  for other counseling recommendations.  Return in about 1 week (around 07/28/2019) for ROB, NST for PD.   Devinn Voshell L, DO

## 2019-07-21 NOTE — Patient Instructions (Addendum)
Signs and Symptoms of Labor Labor is your body's natural process of moving your baby, placenta, and umbilical cord out of your uterus. The process of labor usually starts when your baby is full-term, between 37 and 40 weeks of pregnancy. How will I know when I am close to going into labor? As your body prepares for labor and the birth of your baby, you may notice the following symptoms in the weeks and days before true labor starts:  Having a strong desire to get your home ready to receive your new baby. This is called nesting. Nesting may be a sign that labor is approaching, and it may occur several weeks before birth. Nesting may involve cleaning and organizing your home.  Passing a small amount of thick, bloody mucus out of your vagina (normal bloody show or losing your mucus plug). This may happen more than a week before labor begins, or it might occur right before labor begins as the opening of the cervix starts to widen (dilate). For some women, the entire mucus plug passes at once. For others, smaller portions of the mucus plug may gradually pass over several days.  Your baby moving (dropping) lower in your pelvis to get into position for birth (lightening). When this happens, you may feel more pressure on your bladder and pelvic bone and less pressure on your ribs. This may make it easier to breathe. It may also cause you to need to urinate more often and have problems with bowel movements.  Having "practice contractions" (Braxton Hicks contractions) that occur at irregular (unevenly spaced) intervals that are more than 10 minutes apart. This is also called false labor. False labor contractions are common after exercise or sexual activity, and they will stop if you change position, rest, or drink fluids. These contractions are usually mild and do not get stronger over time. They may feel like: ? A backache or back pain. ? Mild cramps, similar to menstrual cramps. ? Tightening or pressure in  your abdomen. Other early symptoms that labor may be starting soon include:  Nausea or loss of appetite.  Diarrhea.  Having a sudden burst of energy, or feeling very tired.  Mood changes.  Having trouble sleeping. How will I know when labor has begun? Signs that true labor has begun may include:  Having contractions that come at regular (evenly spaced) intervals and increase in intensity. This may feel like more intense tightening or pressure in your abdomen that moves to your back. ? Contractions may also feel like rhythmic pain in your upper thighs or back that comes and goes at regular intervals. ? For first-time mothers, this change in intensity of contractions often occurs at a more gradual pace. ? Women who have given birth before may notice a more rapid progression of contraction changes.  Having a feeling of pressure in the vaginal area.  Your water breaking (rupture of membranes). This is when the sac of fluid that surrounds your baby breaks. When this happens, you will notice fluid leaking from your vagina. This may be clear or blood-tinged. Labor usually starts within 24 hours of your water breaking, but it may take longer to begin. ? Some women notice this as a gush of fluid. ? Others notice that their underwear repeatedly becomes damp. Follow these instructions at home:   When labor starts, or if your water breaks, call your health care provider or nurse care line. Based on your situation, they will determine when you should go in for an   exam.  When you are in early labor, you may be able to rest and manage symptoms at home. Some strategies to try at home include: ? Breathing and relaxation techniques. ? Taking a warm bath or shower. ? Listening to music. ? Using a heating pad on the lower back for pain. If you are directed to use heat:  Place a towel between your skin and the heat source.  Leave the heat on for 20-30 minutes.  Remove the heat if your skin turns  bright red. This is especially important if you are unable to feel pain, heat, or cold. You may have a greater risk of getting burned. Get help right away if:  You have painful, regular contractions that are 5 minutes apart or less.  Labor starts before you are [redacted] weeks along in your pregnancy.  You have a fever.  You have a headache that does not go away.  You have bright red blood coming from your vagina.  You do not feel your baby moving.  You have a sudden onset of: ? Severe headache with vision problems. ? Nausea, vomiting, or diarrhea. ? Chest pain or shortness of breath. These symptoms may be an emergency. If your health care provider recommends that you go to the hospital or birth center where you plan to deliver, do not drive yourself. Have someone else drive you, or call emergency services (911 in the [place] Summary  Labor is your body's natural process of moving your baby, placenta, and umbilical cord out of your uterus.  The process of labor usually starts when your baby is full-term, between 37 and 40 weeks of pregnancy.  When labor starts, or if your water breaks, call your health care provider or nurse care line. Based on your situation, they will determine when you should go in for an exam. This information is not intended to replace advice given to you by your health care provider. Make sure you discuss any questions you have with your health care provider. Document Revised: 01/28/2017 Document Reviewed: 10/05/2016 Elsevier Patient Education  2020 [employer]  [employer]  This circuit takes at least 90 minutes to complete so clear your schedule and make mental preparations so you can relax in your environment. The second step requires a lot of pillows so gather them up before beginning Before starting, you should empty your bladder! Have a nice drink nearby, and make sure it has a straw! If you are having contractions, this circuit should be done  through contractions, try not to change positions between steps Before you begin...  "I named this 'circuit' after my friend Megan Miles, who shared and discussed it with me when I was working with a client whose labor seemed to be stalled out and no longer progressing... This circuit is useful to help get the baby lined up, ideally, in the "Left Occiput Anterior" (LOA) Position, both before labor begins and when some corrections need to be done during labor. Prenatally, this position set can help to rotate a baby. As a natural method of induction, this can help get things going if baby just needed a gentle nudge of position to set things off. To the best of my knowledge, this group of positions will not "hurt" a baby that is already lined up correctly." - Sharon Muza   Step One: Open-knee Chest Stay in this position for 30 minutes, start in cat/cow, then drop your chest as low as you can to the bed or the floor   and your bottom as high as you can. Knees should be fairly wide apart, and the angle between the torso/thighs should be wider than 90 degrees. Wiggle around, prop with lots of pillows and use this time to get totally relaxed. This position allows the baby to scoot out of the pelvis a bit and gives them room to rotate, shift their head position, etc. If the pregnant person finds it helpful, careful positioning with a rebozo under the belly, with gentle tension from a support person behind can help maintain this position for the full 30 minutes.  Step Two:Exaggerated Left Side Lying Roll to your left side, bringing your top leg as high as possible and keeping your bottom leg straight. Roll forward as much as possible, again using a lot of pillows. Sink into the bed and relax some more. If you fall asleep, that's totally okay and you can stay there! If not, stay here for at least another half an hour. Try and get your top right leg up towards your head and get as rolled  over onto your belly as much as possible. If you repeat the circuit during labor, try alternating left and right sides. We know the photo the left is actually right side... just flip the image in your head.  Step Three: Moving and Lunges Lunge, walk stairs facing sideways, 2 at a time, (have a spotter downstairs of you!), take a walk outside with one foot on the curb and the other on the street, sit on a birth ball and hula- anything that's upright and putting your pelvis in open, asymmetrical positions. Spend at least 30 minutes doing this one as well to give your baby a chance to move down. If you are lunging or stair or curb walking, you should lunge/walk/go up stairs in the direction that feels better to you. The key with the lunge is that the toes of the higher leg and mom's belly button should be at right angles. Do not lunge over your knee, that closes the pelvis.     Megan Hamilton Miles: Circuit Creator - [email or web address] Sharon Muza, CD, BDT (DONA), LCCE, FACCE: Supporting Content - [email or web address] Emily Weaver Brown: Photography - [email or web address] Kate Dewey CD/CDT (BAI): Print and Webmaster - [email or web address] [employer] [email or web address]  

## 2019-07-23 ENCOUNTER — Telehealth (HOSPITAL_COMMUNITY): Payer: Self-pay | Admitting: *Deleted

## 2019-07-23 NOTE — Telephone Encounter (Signed)
Preadmission screen  

## 2019-07-24 ENCOUNTER — Telehealth (HOSPITAL_COMMUNITY): Payer: Self-pay | Admitting: *Deleted

## 2019-07-24 ENCOUNTER — Encounter (HOSPITAL_COMMUNITY): Payer: Self-pay | Admitting: *Deleted

## 2019-07-24 NOTE — Telephone Encounter (Signed)
Preadmission screen  

## 2019-07-27 ENCOUNTER — Inpatient Hospital Stay (HOSPITAL_COMMUNITY)
Admission: AD | Admit: 2019-07-27 | Discharge: 2019-07-29 | DRG: 807 | Disposition: A | Discharge status: Home / Self Care | Payer: BC Managed Care – PPO | Attending: Obstetrics & Gynecology | Admitting: Obstetrics & Gynecology

## 2019-07-27 ENCOUNTER — Other Ambulatory Visit: Payer: Self-pay

## 2019-07-27 ENCOUNTER — Ambulatory Visit (INDEPENDENT_AMBULATORY_CARE_PROVIDER_SITE_OTHER): Payer: BC Managed Care – PPO | Admitting: Advanced Practice Midwife

## 2019-07-27 VITALS — BP 130/80 | HR 85 | Wt 207.9 lb

## 2019-07-27 DIAGNOSIS — Z3A40 40 weeks gestation of pregnancy: Secondary | ICD-10-CM

## 2019-07-27 DIAGNOSIS — D649 Anemia, unspecified: Secondary | ICD-10-CM | POA: Diagnosis present

## 2019-07-27 DIAGNOSIS — O368130 Decreased fetal movements, third trimester, not applicable or unspecified: Principal | ICD-10-CM | POA: Diagnosis present

## 2019-07-27 DIAGNOSIS — O9902 Anemia complicating childbirth: Secondary | ICD-10-CM | POA: Diagnosis present

## 2019-07-27 DIAGNOSIS — O480 Post-term pregnancy: Secondary | ICD-10-CM

## 2019-07-27 DIAGNOSIS — O288 Other abnormal findings on antenatal screening of mother: Secondary | ICD-10-CM | POA: Diagnosis present

## 2019-07-27 DIAGNOSIS — O368330 Maternal care for abnormalities of the fetal heart rate or rhythm, third trimester, not applicable or unspecified: Secondary | ICD-10-CM | POA: Diagnosis not present

## 2019-07-27 DIAGNOSIS — Z20822 Contact with and (suspected) exposure to covid-19: Secondary | ICD-10-CM | POA: Diagnosis present

## 2019-07-27 DIAGNOSIS — Z3400 Encounter for supervision of normal first pregnancy, unspecified trimester: Secondary | ICD-10-CM

## 2019-07-27 LAB — TYPE AND SCREEN
ABO/RH(D): O POS
Antibody Screen: NEGATIVE

## 2019-07-27 LAB — CBC
HCT: 34.5 % — ABNORMAL LOW (ref 36.0–46.0)
Hemoglobin: 11.2 g/dL — ABNORMAL LOW (ref 12.0–15.0)
MCH: 28.1 pg (ref 26.0–34.0)
MCHC: 32.5 g/dL (ref 30.0–36.0)
MCV: 86.5 fL (ref 80.0–100.0)
Platelets: 256 10*3/uL (ref 150–400)
RBC: 3.99 MIL/uL (ref 3.87–5.11)
RDW: 22.4 % — ABNORMAL HIGH (ref 11.5–15.5)
WBC: 7.3 10*3/uL (ref 4.0–10.5)
nRBC: 0 % (ref 0.0–0.2)

## 2019-07-27 LAB — SARS CORONAVIRUS 2 (TAT 6-24 HRS): SARS Coronavirus 2: NEGATIVE

## 2019-07-27 MED ORDER — FENTANYL CITRATE (PF) 100 MCG/2ML IJ SOLN
INTRAMUSCULAR | Status: AC
  Filled 2019-07-27: qty 2

## 2019-07-27 MED ORDER — ACETAMINOPHEN 325 MG PO TABS
650.0000 mg | ORAL_TABLET | ORAL | Status: DC | PRN
  Administered 2019-07-27: 20:00:00 650 mg via ORAL
  Filled 2019-07-27: qty 2

## 2019-07-27 MED ORDER — LACTATED RINGERS IV SOLN
500.0000 mL | Freq: Once | INTRAVENOUS | Status: AC
  Administered 2019-07-28: 500 mL via INTRAVENOUS

## 2019-07-27 MED ORDER — OXYTOCIN 40 UNITS IN NORMAL SALINE INFUSION - SIMPLE MED
2.5000 [IU]/h | INTRAVENOUS | Status: DC
  Administered 2019-07-28: 04:00:00 2.5 [IU]/h via INTRAVENOUS
  Filled 2019-07-27: qty 1000

## 2019-07-27 MED ORDER — MISOPROSTOL 50MCG HALF TABLET
50.0000 ug | ORAL_TABLET | ORAL | Status: DC
  Administered 2019-07-27 (×2): 50 ug via BUCCAL
  Filled 2019-07-27 (×2): qty 1

## 2019-07-27 MED ORDER — DIPHENHYDRAMINE HCL 50 MG/ML IJ SOLN: 12.5000 mg | INTRAMUSCULAR | Status: DC | PRN

## 2019-07-27 MED ORDER — LACTATED RINGERS IV SOLN: 500.0000 mL | INTRAVENOUS | Status: DC | PRN

## 2019-07-27 MED ORDER — EPHEDRINE 5 MG/ML INJ: 10.0000 mg | INTRAVENOUS | Status: DC | PRN

## 2019-07-27 MED ORDER — OXYTOCIN BOLUS FROM INFUSION
500.0000 mL | Freq: Once | INTRAVENOUS | Status: AC
  Administered 2019-07-28: 04:00:00 500 mL via INTRAVENOUS

## 2019-07-27 MED ORDER — FENTANYL CITRATE (PF) 100 MCG/2ML IJ SOLN
100.0000 ug | Freq: Once | INTRAMUSCULAR | Status: AC
  Administered 2019-07-27: 19:00:00 100 ug via INTRAVENOUS

## 2019-07-27 MED ORDER — SOD CITRATE-CITRIC ACID 500-334 MG/5ML PO SOLN: 30.0000 mL | ORAL | Status: DC | PRN

## 2019-07-27 MED ORDER — ONDANSETRON HCL 4 MG/2ML IJ SOLN: 4.0000 mg | Freq: Four times a day (QID) | INTRAMUSCULAR | Status: DC | PRN

## 2019-07-27 MED ORDER — TERBUTALINE SULFATE 1 MG/ML IJ SOLN: 0.2500 mg | Freq: Once | INTRAMUSCULAR | Status: DC | PRN

## 2019-07-27 MED ORDER — PHENYLEPHRINE 40 MCG/ML (10ML) SYRINGE FOR IV PUSH (FOR BLOOD PRESSURE SUPPORT): 80.0000 ug | PREFILLED_SYRINGE | INTRAVENOUS | Status: DC | PRN

## 2019-07-27 MED ORDER — PHENYLEPHRINE 40 MCG/ML (10ML) SYRINGE FOR IV PUSH (FOR BLOOD PRESSURE SUPPORT)
80.0000 ug | PREFILLED_SYRINGE | INTRAVENOUS | Status: DC | PRN
  Filled 2019-07-27: qty 10

## 2019-07-27 MED ORDER — LACTATED RINGERS IV SOLN: INTRAVENOUS | Status: DC

## 2019-07-27 MED ORDER — FENTANYL-BUPIVACAINE-NACL 0.5-0.125-0.9 MG/250ML-% EP SOLN
12.0000 mL/h | EPIDURAL | Status: DC | PRN
  Filled 2019-07-27: qty 250

## 2019-07-27 MED ORDER — FENTANYL CITRATE (PF) 100 MCG/2ML IJ SOLN
100.0000 ug | INTRAMUSCULAR | Status: DC | PRN
  Administered 2019-07-27 (×2): 100 ug via INTRAVENOUS
  Filled 2019-07-27 (×2): qty 2

## 2019-07-27 MED ORDER — LIDOCAINE HCL (PF) 1 % IJ SOLN
30.0000 mL | INTRAMUSCULAR | Status: AC | PRN
  Administered 2019-07-28: 3 mL via SUBCUTANEOUS
  Administered 2019-07-28: 2 mL via SUBCUTANEOUS
  Administered 2019-07-28: 5 mL via SUBCUTANEOUS

## 2019-07-27 NOTE — Patient Instructions (Signed)

## 2019-07-27 NOTE — H&P (Signed)
Satoria Dunlop is a 24 y.o. female presenting for IOL due to patient report of decreased fetal movement and non-reactive NST in clinic earlier today. On arrival to L&D she denies abdominal pain, vaginal bleeding, leaking of fluid, fever, falls, or recent illness.   Prenatal History --Dating by 6 week [place] --[facility] --Care initiated at 12w 2d --Low risk NIPS --No referrals --Rubella Immune . OB History    Gravida  1   Para      Term      Preterm      AB      Living        SAB      TAB      Ectopic      Multiple      Live Births             Patient Active Problem List   Diagnosis Date Noted  . Non-reactive NST (non-stress test) 07/27/2019  . Supervision of normal first pregnancy, antepartum 01/13/2019   Past Medical History:  Diagnosis Date  . Anemia 04/2019   anemia in pregnancy   No past surgical history on file. Family History: family history is not on file. Social History:  reports that she has never smoked. She has never used smokeless tobacco. She reports previous drug use. She reports that she does not drink alcohol.     Maternal Diabetes: No Genetic Screening: Normal Maternal Ultrasounds/Referrals: Normal Fetal Ultrasounds or other Referrals:  None Maternal Substance Abuse:  No Significant Maternal Medications:  None Significant Maternal Lab Results:  Group B Strep negative Other Comments:  None  Review of Systems  Constitutional: Negative for chills, fatigue and fever.  Respiratory: Negative for cough and shortness of breath.   Gastrointestinal: Negative for abdominal pain.  Genitourinary: Negative for vaginal bleeding, vaginal discharge and vaginal pain.  Musculoskeletal: Negative for back pain.  All other systems reviewed and are negative.    Last menstrual period 10/13/2018, unknown if currently breastfeeding. Physical Exam  Nursing note and vitals reviewed. Constitutional: She is oriented to person, place, and time. She appears  well-developed and well-nourished.  Cardiovascular: Normal rate and normal heart sounds.  Respiratory: Effort normal and breath sounds normal.  GI: Soft.  Gravid  Neurological: She is alert and oriented to person, place, and time.  Skin: Skin is warm and dry.  Psychiatric: She has a normal mood and affect. Her behavior is normal. Judgment and thought content normal.    Prenatal labs: ABO, Rh: O/Positive/-- (09/01 1432) Antibody: Negative (09/01 1432) Rubella: 1.13 (09/01 1432) RPR: Non Reactive (12/22 1011)  HBsAg: Negative (09/01 1432)  HIV: Non Reactive (12/22 1011)  GBS: Negative/-- (02/16 0450)   Fetal Tracing: Cat I Baseline 145, mod var, + 15 x 15 accels, no decels Toco: rare contractions  Assessment/Plan: --24 y.o. G1P0 at [redacted]w[redacted]d   --Cat I tracing --GBS Neg --Foley balloon placed at 1815 --Buccal Cytotec ordered, continue q 4 PRN --Assess for Pitocin when foley balloon dislodges --Reviewed options for pain management --Patient planning epidural --boy/outpatient circ/both/ Nexplanon  Calvert Cantor , CNM 07/27/2019, 6:21 PM

## 2019-07-27 NOTE — Progress Notes (Signed)
Labor Progress Note Brittany Rhodes is a 24 y.o. G1P0 at 37w1dpresented for IOL for NRNST and DFM at term. S: Feeling ctx. Met patient and discussed plan.   O:  BP 117/77   Pulse 93   Temp 98.6 F (37 C) (Oral)   Resp 15   Ht _0  (1.676 m)   Wt 95.3 kg   LMP 10/13/2018   BMI 33.91 kg/m  EFM: 145, moderate variability, pos accels, no decels, reactive TOCO: q3-42mCVE: Dilation: 4.5 Effacement (%): 50 Station: -3 Presentation: Vertex Exam by:: Dr. FaMarice Potter A&P: [age].o. G1P0 4070w1dre for IOL for NRNST and DFM at term. #Labor: Progressing well. S/p Cytotec and FB with good cervical change. Due to thickness will give one more Cytotec and the plan to start Pit at next exam. AROM as indicated. Anticipate SVD. #Pain: per patient request #FWB: Cat I #GBS negative  CheChauncey MannD 10:22 PM

## 2019-07-27 NOTE — Progress Notes (Addendum)
   PRENATAL VISIT NOTE  Subjective:  Brittany Rhodes is a 24 y.o. G1P0 at [redacted]w[redacted]d by 6 week [place] being seen today for ongoing prenatal care.  She is currently monitored for the following issues for this low-risk pregnancy and has Supervision of normal first pregnancy, antepartum on their problem list.  Patient reports decreased fetal movement.   .  .  Movement: Present. Denies leaking of fluid.   The following portions of the patient's history were reviewed and updated as appropriate: allergies, current medications, past family history, past medical history, past social history, past surgical history and problem list.   Objective:   Vitals:   07/27/19 1341  BP: 130/80  Pulse: 85  Weight: 207 lb 14.4 oz (94.3 kg)    Fetal Status: Fetal Heart Rate (bpm): 154 Fundal Height: 41 cm Movement: Present  Presentation: Vertex  General:  Alert, oriented and cooperative. Patient is in no acute distress.  Skin: Skin is warm and dry. No rash noted.   Cardiovascular: Normal heart rate noted  Respiratory: Normal respiratory effort, no problems with respiration noted  Abdomen: Soft, gravid, appropriate for gestational age.  Pain/Pressure: Absent     Pelvic: Cervical exam performed Dilation: 1 Effacement (%): 50 Station: -3  Extremities: Normal range of motion.     Mental Status: Normal mood and affect. Normal behavior. Normal judgment and thought content.   Assessment and Plan:  Pregnancy: G1P0 at [redacted]w[redacted]d 1. Supervision of normal first pregnancy, antepartum   2. Post-term pregnancy, 40-42 weeks of gestation --IOL scheduled 07/31/19 --NST today not reactive, one 10 x 10, no 15 x 15 accels, no decels, with moderate variability.  Given nonreactive tracing and decreased fetal movement, IOL today for postdates.    Term labor symptoms and general obstetric precautions including but not limited to vaginal bleeding, contractions, leaking of fluid and fetal movement were reviewed in detail with the  patient. Please refer to After Visit Summary for other counseling recommendations.   No follow-ups on file.  Future Appointments  Date Time Provider Department Center  07/29/2019  9:30 AM [facility]-SCREENING [facility] None  07/31/2019  9:10 AM [facility]-LD SCHED ROOM [facility] None    Sharen Counter, CNM

## 2019-07-28 ENCOUNTER — Encounter: Payer: BC Managed Care – PPO | Admitting: Obstetrics and Gynecology

## 2019-07-28 ENCOUNTER — Inpatient Hospital Stay (HOSPITAL_COMMUNITY): Payer: BC Managed Care – PPO | Admitting: Anesthesiology

## 2019-07-28 ENCOUNTER — Encounter (HOSPITAL_COMMUNITY): Payer: Self-pay | Admitting: Obstetrics & Gynecology

## 2019-07-28 DIAGNOSIS — O368330 Maternal care for abnormalities of the fetal heart rate or rhythm, third trimester, not applicable or unspecified: Secondary | ICD-10-CM

## 2019-07-28 DIAGNOSIS — Z3A40 40 weeks gestation of pregnancy: Secondary | ICD-10-CM

## 2019-07-28 LAB — RPR: RPR Ser Ql: NONREACTIVE

## 2019-07-28 MED ORDER — BENZOCAINE-MENTHOL 20-0.5 % EX AERO
1.0000 "application " | INHALATION_SPRAY | CUTANEOUS | Status: DC | PRN
  Administered 2019-07-28: 1 via TOPICAL
  Filled 2019-07-28: qty 56

## 2019-07-28 MED ORDER — WITCH HAZEL-GLYCERIN EX PADS: 1.0000 "application " | MEDICATED_PAD | CUTANEOUS | Status: DC | PRN

## 2019-07-28 MED ORDER — SODIUM CHLORIDE (PF) 0.9 % IJ SOLN
INTRAMUSCULAR | Status: DC | PRN
  Administered 2019-07-28: 12 mL/h via EPIDURAL

## 2019-07-28 MED ORDER — ACETAMINOPHEN 325 MG PO TABS
650.0000 mg | ORAL_TABLET | Freq: Four times a day (QID) | ORAL | Status: DC | PRN
  Administered 2019-07-28: 650 mg via ORAL
  Filled 2019-07-28 (×2): qty 2

## 2019-07-28 MED ORDER — LACTATED RINGERS IV SOLN: 500.0000 mL | Freq: Once | INTRAVENOUS | Status: DC

## 2019-07-28 MED ORDER — ONDANSETRON HCL 4 MG/2ML IJ SOLN: 4.0000 mg | INTRAMUSCULAR | Status: DC | PRN

## 2019-07-28 MED ORDER — PRENATAL MULTIVITAMIN CH
1.0000 | ORAL_TABLET | Freq: Every day | ORAL | Status: DC
  Administered 2019-07-28 – 2019-07-29 (×2): 1 via ORAL
  Filled 2019-07-28 (×2): qty 1

## 2019-07-28 MED ORDER — EPHEDRINE 5 MG/ML INJ: 10.0000 mg | INTRAVENOUS | Status: DC | PRN

## 2019-07-28 MED ORDER — IBUPROFEN 600 MG PO TABS
600.0000 mg | ORAL_TABLET | Freq: Three times a day (TID) | ORAL | Status: DC | PRN
  Administered 2019-07-28 – 2019-07-29 (×4): 600 mg via ORAL
  Filled 2019-07-28 (×4): qty 1

## 2019-07-28 MED ORDER — COCONUT OIL OIL
1.0000 "application " | TOPICAL_OIL | Status: DC | PRN
  Administered 2019-07-28: 1 via TOPICAL

## 2019-07-28 MED ORDER — LACTATED RINGERS AMNIOINFUSION: INTRAVENOUS | Status: DC

## 2019-07-28 MED ORDER — MEASLES, MUMPS & RUBELLA VAC IJ SOLR: 0.5000 mL | Freq: Once | INTRAMUSCULAR | Status: DC

## 2019-07-28 MED ORDER — TETANUS-DIPHTH-ACELL PERTUSSIS 5-2.5-18.5 LF-MCG/0.5 IM SUSP: 0.5000 mL | Freq: Once | INTRAMUSCULAR | Status: DC

## 2019-07-28 MED ORDER — DIBUCAINE (PERIANAL) 1 % EX OINT: 1.0000 "application " | TOPICAL_OINTMENT | CUTANEOUS | Status: DC | PRN

## 2019-07-28 MED ORDER — ONDANSETRON HCL 4 MG PO TABS: 4.0000 mg | ORAL_TABLET | ORAL | Status: DC | PRN

## 2019-07-28 MED ORDER — DIPHENHYDRAMINE HCL 25 MG PO CAPS: 25.0000 mg | ORAL_CAPSULE | Freq: Four times a day (QID) | ORAL | Status: DC | PRN

## 2019-07-28 MED ORDER — SENNOSIDES-DOCUSATE SODIUM 8.6-50 MG PO TABS
2.0000 | ORAL_TABLET | ORAL | Status: DC
  Administered 2019-07-28: 23:00:00 2 via ORAL
  Filled 2019-07-28: qty 2

## 2019-07-28 MED ORDER — PHENYLEPHRINE 40 MCG/ML (10ML) SYRINGE FOR IV PUSH (FOR BLOOD PRESSURE SUPPORT): 80.0000 ug | PREFILLED_SYRINGE | INTRAVENOUS | Status: DC | PRN

## 2019-07-28 MED ORDER — SIMETHICONE 80 MG PO CHEW: 80.0000 mg | CHEWABLE_TABLET | ORAL | Status: DC | PRN

## 2019-07-28 NOTE — Anesthesia Procedure Notes (Signed)
Epidural Patient location during procedure: OB Start time: 07/28/2019 1:25 AM End time: 07/28/2019 1:31 AM  Staffing Anesthesiologist: Marcene Duos, MD Performed: anesthesiologist   Preanesthetic Checklist Completed: patient identified, IV checked, site marked, risks and benefits discussed, surgical consent, monitors and equipment checked, pre-op evaluation and timeout performed  Epidural Patient position: sitting Prep: DuraPrep and site prepped and draped Patient monitoring: continuous pulse ox and blood pressure Approach: midline Location: L3-L4 Injection technique: LOR air  Needle:  Needle type: Tuohy  Needle gauge: 17 G Needle length: 9 cm and 9 Needle insertion depth: 7 cm Catheter type: closed end flexible Catheter size: 19 Gauge Catheter at skin depth: 12 cm Test dose: negative  Assessment Events: blood not aspirated, injection not painful, no injection resistance, no paresthesia and negative IV test

## 2019-07-28 NOTE — Lactation Note (Signed)
This note was copied from a baby's chart. Lactation Consultation Note  Patient Name: Brittany Rhodes [id number] Date: 07/28/2019 Reason for consult: Initial assessment;1st time breastfeeding;Primapara;Term  [name] is 24 y.o., P1 who gave birth vaginally to a FT Brittany at [redacted]w[redacted]d. Baby "Brittany Rhodes" is 22 hours old and weighs 8lbs and 14oz. [name] and [name] were present when [facility] student entered the room and Brittany Rhodes was resting in [name]'s arms.  [name] reported (+) breast changes throughout her pregnancy. [name] has large breast tissue with small, flat nipples that evert easily with stimulation. [name] reported that she learned how to hand express and LC student noted drops of colostrum after only a few expressions. [name] was given a 74mm nipple shield and reported that she felt a positive difference when using it during his last feed which was 45 minutes prior to [facility] student entrance. [facility] student recommended that she begin pumping to provide additional stimulation after a feed - mom was agreeable. [facility] student reviewed DEBP set-up, cleaning, and milk storage. [name] pumped for approximately 10 minutes on the right breast utilizing a 106mm flange.   [name] reported that she has a Medela and Evenflo pump at home and utilized [facility] services for this pregnancy.   [facility] student reviewed breastfeeding basics, newborn behavior/feeding cues, and output expectations. LC student encouraged [name] to pump following a feed to provide additional stimulation. [facility] [place] student reviewed outpatient services and community resources. [name] and [name] reported that they had no additional questions or concerns and would call Lactation/nurse PRN.   Feeding Plan: Place Brittany Rhodes STS frequently Allow Brittany Rhodes to feed at the breast 8-12x in 24hr - paying attention to feeding cues Pump/Hand express following each feed for 10-15 minutes    Interventions Interventions: Breast feeding basics reviewed;Hand express;DEBP  Lactation Tools Discussed/Used Tools: Pump Nipple  shield size: 20(nipple shield provided by nurse) Breast pump type: Double-Electric Breast Pump WIC Program: Yes Pump Review: Setup, frequency, and cleaning;Milk Storage Initiated by:: LC Student - Victorino Dike Date initiated:: 07/28/19   Consult Status Consult Status: Follow-up Date: 07/29/19 Follow-up type: In-patient    Gregery Na 07/28/2019, 6:07 PM

## 2019-07-28 NOTE — Plan of Care (Signed)

## 2019-07-28 NOTE — Anesthesia Preprocedure Evaluation (Signed)
Anesthesia Evaluation  Patient identified by MRN, date of birth, ID band Patient awake    Reviewed: Allergy & Precautions, Patient's Chart, lab work & pertinent test results  Airway Mallampati: II  TM Distance: >3 FB     Dental   Pulmonary neg pulmonary ROS,    Pulmonary exam normal        Cardiovascular negative cardio ROS Normal cardiovascular exam     Neuro/Psych negative neurological ROS     GI/Hepatic negative GI ROS, Neg liver ROS,   Endo/Other  negative endocrine ROS  Renal/GU negative Renal ROS     Musculoskeletal   Abdominal   Peds  Hematology  (+) anemia ,   Anesthesia Other Findings   Reproductive/Obstetrics (+) Pregnancy                             Anesthesia Physical Anesthesia Plan  ASA: II  Anesthesia Plan: Epidural   Post-op Pain Management:    Induction:   PONV Risk Score and Plan: Treatment may vary due to age or medical condition  Airway Management Planned: Natural Airway  Additional Equipment:   Intra-op Plan:   Post-operative Plan:   Informed Consent: I have reviewed the patients History and Physical, chart, labs and discussed the procedure including the risks, benefits and alternatives for the proposed anesthesia with the patient or authorized representative who has indicated his/her understanding and acceptance.       Plan Discussed with:   Anesthesia Plan Comments:         Anesthesia Quick Evaluation  

## 2019-07-28 NOTE — Discharge Summary (Signed)
Postpartum Discharge Summary  Date of Service updated     Patient Name: Brittany Rhodes DOB: 03-13-1996 MRN: [id number]  Date of admission: 07/27/2019 Delivering Provider: Chauncey Mann   Date of discharge: 07/29/2019  Admitting diagnosis: Non-reactive NST (non-stress test) [O28.8] Intrauterine pregnancy: [redacted]w[redacted]d    Secondary diagnosis:  Active Problems:   Non-reactive NST (non-stress test)   [redacted] weeks gestation of pregnancy   Postpartum care following vaginal delivery  Additional problems: None     Discharge diagnosis: Term Pregnancy Delivered                                                                                                Post partum procedures: none  Augmentation: AROM, Cytotec and Foley Balloon  Complications: None  Hospital course:  Induction of Labor With Vaginal Delivery   24y.o. yo G1P0 at 473w2das admitted to the hospital 07/27/2019 for induction of labor.  Indication for induction: NRNST and DFM.  Patient had an uncomplicated labor course as follows: Initial SVE: 1.5/50/-3. Patient received 2 Cytotec and Foley balloon. Received epidural. AROM performed. She then progressed to complete.  Membrane Rupture Time/Date: 2:22 AM ,07/28/2019   Intrapartum Procedures: Episiotomy: None [1]                                         Lacerations:  2nd degree [3];Periurethral [8]  Patient had delivery of a Viable infant.  Information for the patient's newborn:  SaTakima, Encina0[[id number]]Delivery Method: Vag-Spont    07/28/2019  Details of delivery can be found in separate delivery note.  Patient had a routine postpartum course. Patient is discharged home 07/29/19. Delivery time: 3:40 AM    Magnesium Sulfate received: No BMZ received: No Rhophylac:No MMR:No Transfusion:No  Physical exam  Vitals:   07/28/19 1429 07/28/19 1821 07/28/19 2150 07/29/19 0422  BP: 115/86 106/68 103/70 116/74  Pulse: 79 81  74  Resp:   18 18  Temp: 98.6 F (37 C) 98.5 F  (36.9 C) 98 F (36.7 C) 97.7 F (36.5 C)  TempSrc: Oral Oral Oral Oral  SpO2: 100% 98%  100%  Weight:      Height:       General: alert, cooperative and no distress Lochia: appropriate Uterine Fundus: firm Incision: n/a DVT Evaluation: No evidence of DVT seen on physical exam. Labs: Lab Results  Component Value Date   WBC 7.3 07/27/2019   HGB 11.2 (L) 07/27/2019   HCT 34.5 (L) 07/27/2019   MCV 86.5 07/27/2019   PLT 256 07/27/2019   No flowsheet data found. Edinburgh Score: Edinburgh Postnatal Depression Scale Screening Tool 07/28/2019  I have been able to laugh and see the funny side of things. 0  I have looked forward with enjoyment to things. 0  I have blamed myself unnecessarily when things went wrong. 0  I have been anxious or worried for no good reason. 0  I have felt scared or panicky for no good reason. 0  Things have been getting on top of me. 0  I have been so unhappy that I have had difficulty sleeping. 0  I have felt sad or miserable. 0  I have been so unhappy that I have been crying. 0  The thought of harming myself has occurred to me. 0  Edinburgh Postnatal Depression Scale Total 0    Discharge instruction: per After Visit Summary and "Baby and Me Booklet".  After visit meds:  Allergies as of 07/29/2019   No Known Allergies     Medication List    STOP taking these medications   Comfort Fit Maternity Supp Med Misc   metroNIDAZOLE 500 MG tablet Commonly known as: FLAGYL     TAKE these medications   Blood Pressure Monitor Kit 1 Device by Does not apply route once a week. To be monitored Regularly at home.   ibuprofen 600 MG tablet Commonly known as: ADVIL Take 1 tablet (600 mg total) by mouth every 8 (eight) hours as needed for mild pain.   Integra 62.5-62.5-40-3 MG Caps Take 1 capsule by mouth daily.       Diet: routine diet  Activity: Advance as tolerated. Pelvic rest for 6 weeks.   Outpatient follow up:4 weeks Follow up  Appt: Future Appointments  Date Time Provider [place]  08/25/2019  1:00 PM Leftwich-Kirby, Kathie Dike, CNM [place] None   Follow up Visit: [place]. Schedule an appointment as soon as possible for a visit in 4 week(s).   Specialty: Obstetrics and Gynecology Contact information: [address] [place] [phone]            Please schedule this patient for Postpartum visit in: 4 weeks with the following provider: Any provider Virtual For C/S patients schedule nurse incision check in weeks 2 weeks: no Low risk pregnancy complicated by: none Delivery mode:  SVD Anticipated Birth Control:  Nexplanon PP Procedures needed: None  Schedule Integrated BH visit: no     Newborn Data: Live born female  Birth Weight:  8+14 APGAR: 47, 9  Newborn Delivery   Birth date/time: 07/28/2019 03:40:00 Delivery type: Vaginal, Spontaneous      Baby Feeding: Breast Disposition:home with mother   07/29/2019 Hansel Feinstein, CNM

## 2019-07-28 NOTE — Anesthesia Postprocedure Evaluation (Signed)
Anesthesia Post Note  Patient: Brittany Rhodes  Procedure(s) Performed: AN AD HOC LABOR EPIDURAL     Patient location during evaluation: Mother Baby Anesthesia Type: Epidural Level of consciousness: awake and alert and oriented Pain management: satisfactory to patient Vital Signs Assessment: post-procedure vital signs reviewed and stable Respiratory status: respiratory function stable Cardiovascular status: stable Postop Assessment: no headache, no backache, epidural receding, patient able to bend at knees, no signs of nausea or vomiting and adequate PO intake Anesthetic complications: no    Last Vitals:  Vitals:   07/28/19 1028 07/28/19 1429  BP: 123/66 115/86  Pulse: 86 79  Resp: 16   Temp: 36.9 C 37 C  SpO2: 97% 100%    Last Pain:  Vitals:   07/28/19 1431  TempSrc:   PainSc: 2    Pain Goal: Patients Stated Pain Goal: 0 (07/28/19 1431)                 [name]

## 2019-07-28 NOTE — Progress Notes (Signed)
Labor Progress Note Gavrielle Streck is a 25 y.o. G1P0 at [redacted]w[redacted]d presented for IOL for NRNST and DFM at term. S: Getting comfortable with epidural. Feeling pressure.   O:  BP 120/79   Pulse 90   Temp 98.5 F (36.9 C) (Oral)   Resp 16   Ht 5\' 6"  (1.676 m)   Wt 95.3 kg   LMP 10/13/2018   BMI 33.91 kg/m  EFM: 145, moderate variability, pos accels, variable decel with AROM, reactive TOCO: q3-53m  CVE: Dilation: 6.5 Effacement (%): 80 Station: -1 Presentation: Vertex Exam by:: [email or web address], RN   A&P: 24 y.o. G1P0 [redacted]w[redacted]d here for IOL for NRNST and DFM at term. #Labor: Progressing well. S/p Cytotec and FB with good cervical change. AROM with clear fluid at this exam. Anticipate SVD. #Pain: epidural  #FWB: Cat II; reassuring for moderate variability and occasional accels  #GBS negative  [redacted]w[redacted]d, MD 2:25 AM

## 2019-07-29 ENCOUNTER — Other Ambulatory Visit (HOSPITAL_COMMUNITY): Payer: BC Managed Care – PPO

## 2019-07-29 MED ORDER — IBUPROFEN 600 MG PO TABS: 600.0000 mg | ORAL_TABLET | Freq: Three times a day (TID) | ORAL | 0 refills | Status: DC | PRN

## 2019-07-29 NOTE — Lactation Note (Signed)
This note was copied from a baby's chart. Lactation Consultation Note:  Mother is a P1, infant is 64 hours old and is now at % wt loss.  . Mother reports that infant was fussy at the breast last night and she ask for a bottle of formula. Mother reports that she told MD that she thinks she is going to pump and bottle feed.  Mother last pumped last night. Discussed importance of pumping every 2-3 hours to protect milk supply.  Reviewed hand expression with mother. Observed large drops of colostrum. Mother was given a harmony hand pump with instructions. Mothers nipples are erect with compressible breast tissue. No observed trama of mothers nipples.  Mother switched to a #24 NS if she decides to put infant back to breast.   She is active with WIC . Mother has a DEBP sat up at the bedside. Mother reports that she has a Medela and a Lansinoh at home.   Plan of Care : Breastfeed infant with feeding cues Supplement infant with ebm/formula, according to supplemental guidelines. Pump using a DEBP after each feeding for 15-20 mins.  Discussed treatment and prevention of engorgement.  Mother to continue to cue base feed infant and feed at least 8-12 times or more in 24 hours and advised to allow for cluster feeding infant as needed.   Mother to continue to due STS. Mother is aware of available LC services at [facility], BFS[facility]'S, OP Dept, and phone # for questions or concerns about breastfeeding.  Mother receptive to all teaching and plan of care.    Patient Name: Boy Nareh Matzke [id number] Date: 07/29/2019 Reason for consult: Follow-up assessment   Maternal Data    Feeding Feeding Type: Breast Fed  LATCH Score                   Interventions Interventions: Hand express;Hand pump  Lactation Tools Discussed/Used Tools: Nipple Dorris Carnes   Consult Status Consult Status: Complete    Michel Bickers 07/29/2019, 10:12 AM

## 2019-07-29 NOTE — Discharge Instructions (Signed)
Etonogestrel implant What is this medicine? ETONOGESTREL (et oh noe JES trel) is a contraceptive (birth control) device. It is used to prevent pregnancy. It can be used for up to 3 years. This medicine may be used for other purposes; ask your health care provider or pharmacist if you have questions. COMMON BRAND NAME(S): Implanon, Nexplanon What should I tell my health care provider before I take this medicine? They need to know if you have any of these conditions:  abnormal vaginal bleeding  blood vessel disease or blood clots  breast, cervical, endometrial, ovarian, liver, or uterine cancer  diabetes  gallbladder disease  heart disease or recent heart attack  high blood pressure  high cholesterol or triglycerides  kidney disease  liver disease  migraine headaches  seizures  stroke  tobacco smoker  an unusual or allergic reaction to etonogestrel, anesthetics or antiseptics, other medicines, foods, dyes, or preservatives  pregnant or trying to get pregnant  breast-feeding How should I use this medicine? This device is inserted just under the skin on the inner side of your upper arm by a health care professional. Talk to your pediatrician regarding the use of this medicine in children. Special care may be needed. Overdosage: If you think you have taken too much of this medicine contact a poison control center or emergency room at once. NOTE: This medicine is only for you. Do not share this medicine with others. What if I miss a dose? This does not apply. What may interact with this medicine? Do not take this medicine with any of the following medications:  amprenavir  fosamprenavir This medicine may also interact with the following medications:  acitretin  aprepitant  armodafinil  bexarotene  bosentan  carbamazepine  certain medicines for fungal infections like fluconazole, ketoconazole, itraconazole and voriconazole  certain medicines to treat  hepatitis, HIV or AIDS  cyclosporine  felbamate  griseofulvin  lamotrigine  modafinil  oxcarbazepine  phenobarbital  phenytoin  primidone  rifabutin  rifampin  rifapentine  St. John's wort  topiramate This list may not describe all possible interactions. Give your health care provider a list of all the medicines, herbs, non-prescription drugs, or dietary supplements you use. Also tell them if you smoke, drink alcohol, or use illegal drugs. Some items may interact with your medicine. What should I watch for while using this medicine? This product does not protect you against HIV infection (AIDS) or other sexually transmitted diseases. You should be able to feel the implant by pressing your fingertips over the skin where it was inserted. Contact your doctor if you cannot feel the implant, and use a non-hormonal birth control method (such as condoms) until your doctor confirms that the implant is in place. Contact your doctor if you think that the implant may have broken or become bent while in your arm. You will receive a user card from your health care provider after the implant is inserted. The card is a record of the location of the implant in your upper arm and when it should be removed. Keep this card with your health records. What side effects may I notice from receiving this medicine? Side effects that you should report to your doctor or health care professional as soon as possible:  allergic reactions like skin rash, itching or hives, swelling of the face, lips, or tongue  breast lumps, breast tissue changes, or discharge  breathing problems  changes in emotions or moods  coughing up blood  if you feel that the implant   may have broken or bent while in your arm  high blood pressure  pain, irritation, swelling, or bruising at the insertion site  scar at site of insertion  signs of infection at the insertion site such as fever, and skin redness, pain or  discharge  signs and symptoms of a blood clot such as breathing problems; changes in vision; chest pain; severe, sudden headache; pain, swelling, warmth in the leg; trouble speaking; sudden numbness or weakness of the face, arm or leg  signs and symptoms of liver injury like dark yellow or brown urine; general ill feeling or flu-like symptoms; light-colored stools; loss of appetite; nausea; right upper belly pain; unusually weak or tired; yellowing of the eyes or skin  unusual vaginal bleeding, discharge Side effects that usually do not require medical attention (report to your doctor or health care professional if they continue or are bothersome):  acne  breast pain or tenderness  headache  irregular menstrual bleeding  nausea This list may not describe all possible side effects. Call your doctor for medical advice about side effects. You may report side effects to FDA at 1-800-FDA-1088. Where should I keep my medicine? This drug is given in a hospital or clinic and will not be stored at home. NOTE: This sheet is a summary. It may not cover all possible information. If you have questions about this medicine, talk to your doctor, pharmacist, or health care provider.  2020 Elsevier/Gold Standard (2019-02-10 11:33:04) Postpartum Care After Vaginal Delivery This sheet gives you information about how to care for yourself from the time you deliver your baby to up to 6-12 weeks after delivery (postpartum period). Your health care provider may also give you more specific instructions. If you have problems or questions, contact your health care provider. Follow these instructions at home: Vaginal bleeding  It is normal to have vaginal bleeding (lochia) after delivery. Wear a sanitary pad for vaginal bleeding and discharge. ? During the first week after delivery, the amount and appearance of lochia is often similar to a menstrual period. ? Over the next few weeks, it will gradually decrease to  a dry, yellow-brown discharge. ? For most women, lochia stops completely by 4-6 weeks after delivery. Vaginal bleeding can vary from woman to woman.  Change your sanitary pads frequently. Watch for any changes in your flow, such as: ? A sudden increase in volume. ? A change in color. ? Large blood clots.  If you pass a blood clot from your vagina, save it and call your health care provider to discuss. Do not flush blood clots down the toilet before talking with your health care provider.  Do not use tampons or douches until your health care provider says this is safe.  If you are not breastfeeding, your period should return 6-8 weeks after delivery. If you are feeding your child breast milk only (exclusive breastfeeding), your period may not return until you stop breastfeeding. Perineal care  Keep the area between the vagina and the anus (perineum) clean and dry as told by your health care provider. Use medicated pads and pain-relieving sprays and creams as directed.  If you had a cut in the perineum (episiotomy) or a tear in the vagina, check the area for signs of infection until you are healed. Check for: ? More redness, swelling, or pain. ? Fluid or blood coming from the cut or tear. ? Warmth. ? Pus or a bad smell.  You may be given a squirt bottle to use instead  of wiping to clean the perineum area after you go to the bathroom. As you start healing, you may use the squirt bottle before wiping yourself. Make sure to wipe gently.  To relieve pain caused by an episiotomy, a tear in the vagina, or swollen veins in the anus (hemorrhoids), try taking a warm sitz bath 2-3 times a day. A sitz bath is a warm water bath that is taken while you are sitting down. The water should only come up to your hips and should cover your buttocks. Breast care  Within the first few days after delivery, your breasts may feel heavy, full, and uncomfortable (breast engorgement). Milk may also leak from your  breasts. Your health care provider can suggest ways to help relieve the discomfort. Breast engorgement should go away within a few days.  If you are breastfeeding: ? Wear a bra that supports your breasts and fits you well. ? Keep your nipples clean and dry. Apply creams and ointments as told by your health care provider. ? You may need to use breast pads to absorb milk that leaks from your breasts. ? You may have uterine contractions every time you breastfeed for up to several weeks after delivery. Uterine contractions help your uterus return to its normal size. ? If you have any problems with breastfeeding, work with your health care provider or Advertising copywriter.  If you are not breastfeeding: ? Avoid touching your breasts a lot. Doing this can make your breasts produce more milk. ? Wear a good-fitting bra and use cold packs to help with swelling. ? Do not squeeze out (express) milk. This causes you to make more milk. Intimacy and sexuality  Ask your health care provider when you can engage in sexual activity. This may depend on: ? Your risk of infection. ? How fast you are healing. ? Your comfort and desire to engage in sexual activity.  You are able to get pregnant after delivery, even if you have not had your period. If desired, talk with your health care provider about methods of birth control (contraception). Medicines  Take over-the-counter and prescription medicines only as told by your health care provider.  If you were prescribed an antibiotic medicine, take it as told by your health care provider. Do not stop taking the antibiotic even if you start to feel better. Activity  Gradually return to your normal activities as told by your health care provider. Ask your health care provider what activities are safe for you.  Rest as much as possible. Try to rest or take a nap while your baby is sleeping. Eating and drinking   Drink enough fluid to keep your urine pale  yellow.  Eat high-fiber foods every day. These may help prevent or relieve constipation. High-fiber foods include: ? Whole grain cereals and breads. ? Brown rice. ? Beans. ? Fresh fruits and vegetables.  Do not try to lose weight quickly by cutting back on calories.  Take your prenatal vitamins until your postpartum checkup or until your health care provider tells you it is okay to stop. Lifestyle  Do not use any products that contain nicotine or tobacco, such as cigarettes and e-cigarettes. If you need help quitting, ask your health care provider.  Do not drink alcohol, especially if you are breastfeeding. General instructions  Keep all follow-up visits for you and your baby as told by your health care provider. Most women visit their health care provider for a postpartum checkup within the first 3-6 weeks after  delivery. Contact a health care provider if:  You feel unable to cope with the changes that your child brings to your life, and these feelings do not go away.  You feel unusually sad or worried.  Your breasts become red, painful, or hard.  You have a fever.  You have trouble holding urine or keeping urine from leaking.  You have little or no interest in activities you used to enjoy.  You have not breastfed at all and you have not had a menstrual period for 12 weeks after delivery.  You have stopped breastfeeding and you have not had a menstrual period for 12 weeks after you stopped breastfeeding.  You have questions about caring for yourself or your baby.  You pass a blood clot from your vagina. Get help right away if:  You have chest pain.  You have difficulty breathing.  You have sudden, severe leg pain.  You have severe pain or cramping in your lower abdomen.  You bleed from your vagina so much that you fill more than one sanitary pad in one hour. Bleeding should not be heavier than your heaviest period.  You develop a severe headache.  You  faint.  You have blurred vision or spots in your vision.  You have bad-smelling vaginal discharge.  You have thoughts about hurting yourself or your baby. If you ever feel like you may hurt yourself or others, or have thoughts about taking your own life, get help right away. You can go to the nearest emergency department or call:  Your local emergency services (911 in the [place]  A suicide crisis helpline, such as the National Suicide Prevention Lifeline at 925-543-7993. This is open 24 hours a day. Summary  The period of time right after you deliver your newborn up to 6-12 weeks after delivery is called the postpartum period.  Gradually return to your normal activities as told by your health care provider.  Keep all follow-up visits for you and your baby as told by your health care provider. This information is not intended to replace advice given to you by your health care provider. Make sure you discuss any questions you have with your health care provider. Document Revised: 05/03/2017 Document Reviewed: 02/11/2017 Elsevier Patient Education  2020 [employer].

## 2019-07-31 ENCOUNTER — Inpatient Hospital Stay (HOSPITAL_COMMUNITY)
Admission: AD | Admit: 2019-07-31 | Payer: BC Managed Care – PPO | Source: Home / Self Care | Admitting: Obstetrics and Gynecology

## 2019-07-31 ENCOUNTER — Inpatient Hospital Stay (HOSPITAL_COMMUNITY)
Admission: AD | Admit: 2019-07-31 | Discharge: 2019-07-31 | Disposition: A | Discharge status: Home / Self Care | Payer: BC Managed Care – PPO | Attending: Obstetrics & Gynecology | Admitting: Obstetrics & Gynecology

## 2019-07-31 ENCOUNTER — Encounter (HOSPITAL_COMMUNITY): Payer: Self-pay | Admitting: Obstetrics & Gynecology

## 2019-07-31 ENCOUNTER — Inpatient Hospital Stay (HOSPITAL_COMMUNITY): Payer: BC Managed Care – PPO

## 2019-07-31 ENCOUNTER — Other Ambulatory Visit: Payer: Self-pay

## 2019-07-31 DIAGNOSIS — O99893 Other specified diseases and conditions complicating puerperium: Secondary | ICD-10-CM | POA: Diagnosis not present

## 2019-07-31 DIAGNOSIS — K6289 Other specified diseases of anus and rectum: Secondary | ICD-10-CM | POA: Insufficient documentation

## 2019-07-31 DIAGNOSIS — M5432 Sciatica, left side: Secondary | ICD-10-CM | POA: Insufficient documentation

## 2019-07-31 LAB — URINALYSIS, ROUTINE W REFLEX MICROSCOPIC
Bilirubin Urine: NEGATIVE
Glucose, UA: NEGATIVE mg/dL
Ketones, ur: NEGATIVE mg/dL
Nitrite: NEGATIVE
Protein, ur: NEGATIVE mg/dL
Specific Gravity, Urine: 1.012 (ref 1.005–1.030)
pH: 7 (ref 5.0–8.0)

## 2019-07-31 MED ORDER — IBUPROFEN 600 MG PO TABS: 600.0000 mg | ORAL_TABLET | Freq: Four times a day (QID) | ORAL | 0 refills | Status: DC | PRN

## 2019-07-31 MED ORDER — KETOROLAC TROMETHAMINE 60 MG/2ML IM SOLN
60.0000 mg | Freq: Once | INTRAMUSCULAR | Status: AC
  Administered 2019-07-31: 60 mg via INTRAMUSCULAR
  Filled 2019-07-31: qty 2

## 2019-07-31 NOTE — MAU Note (Signed)
.   Brittany Rhodes is a 24 y.o.  here in MAU reporting: pain in the right side of her buttocks.States that she had the pain when she was pregnant and her OBGYN told her it would go away after the pregnancy but it has gotten worse LMP: 07/28/19 Onset of complaint: ongoing Pain score: 8 Vitals:   07/31/19 1457  BP: 132/73  Pulse: 83  Resp: 16  Temp: 97.8 F (36.6 C)  SpO2: 100%     FHT: Lab orders placed from triage:

## 2019-07-31 NOTE — MAU Provider Note (Signed)
Chief Complaint: Rectal Pain   First Provider Initiated Contact with Patient 07/31/19 1550      SUBJECTIVE HPI: Brittany Rhodes is a 24 y.o. G1P1001 who is postpartum day 3 following vaginal delivery who presents to maternity admissions reporting pain in her left buttock that radiates down her left leg.  She had this pain in pregnancy but it has worsened since she was discharged from the hospital.  The pain is worse with standing and walking. It is improved when lying down but when she straightens out her leg it brings the pain back.  She had not picked up her ibuprofen prescription but took 400 mg ibuprofen at home yesterday but it did not help the pain. There are no other symptoms. She has not tried any treatments.      HPI  Past Medical History:  Diagnosis Date  . Anemia 04/2019   anemia in pregnancy   Past Surgical History:  Procedure Laterality Date  . NO PAST SURGERIES     Social History   Socioeconomic History  . Marital status: Single    Spouse name: Not on file  . Number of children: Not on file  . Years of education: Not on file  . Highest education level: Not on file  Occupational History  . Not on file  Tobacco Use  . Smoking status: Never Smoker  . Smokeless tobacco: Never Used  Substance and Sexual Activity  . Alcohol use: No  . Drug use: Not Currently  . Sexual activity: Not on file  Other Topics Concern  . Not on file  Social History Narrative  . Not on file   Social Determinants of Health   Financial Resource Strain:   . Difficulty of Paying Living Expenses:   Food Insecurity:   . Worried About Charity fundraiser in the Last Year:   . Arboriculturist in the Last Year:   Transportation Needs:   . Film/video editor (Medical):   Marland Kitchen Lack of Transportation (Non-Medical):   Physical Activity:   . Days of Exercise per Week:   . Minutes of Exercise per Session:   Stress:   . Feeling of Stress :   Social Connections:   . Frequency of  Communication with Friends and Family:   . Frequency of Social Gatherings with Friends and Family:   . Attends Religious Services:   . Active Member of Clubs or Organizations:   . Attends Archivist Meetings:   Marland Kitchen Marital Status:   Intimate Partner Violence:   . Fear of Current or Ex-Partner:   . Emotionally Abused:   Marland Kitchen Physically Abused:   . Sexually Abused:    No current facility-administered medications on file prior to encounter.   Current Outpatient Medications on File Prior to Encounter  Medication Sig Dispense Refill  . Blood Pressure Monitor KIT 1 Device by Does not apply route once a week. To be monitored Regularly at home. 1 kit 0  . Fe Fum-FePoly-Vit C-Vit B3 (INTEGRA) 62.5-62.5-40-3 MG CAPS Take 1 capsule by mouth daily. 30 capsule 2   No Known Allergies  ROS:  Review of Systems  Constitutional: Negative for chills, fatigue and fever.  Eyes: Negative for visual disturbance.  Respiratory: Negative for shortness of breath.   Cardiovascular: Negative for chest pain.  Gastrointestinal: Negative for abdominal pain, nausea and vomiting.  Genitourinary: Negative for difficulty urinating, dysuria, flank pain, pelvic pain, vaginal bleeding, vaginal discharge and vaginal pain.  Musculoskeletal: Positive for arthralgias  and myalgias.  Neurological: Negative for dizziness and headaches.  Psychiatric/Behavioral: Negative.      I have reviewed patient's Past Medical Hx, Surgical Hx, Family Hx, Social Hx, medications and allergies.   Physical Exam   Patient Vitals for the past 24 hrs:  BP Temp Pulse Resp SpO2 Weight  07/31/19 1457 132/73 97.8 F (36.6 C) 83 16 100 % 90.3 kg   Constitutional: Well-developed, well-nourished female in no acute distress.  Cardiovascular: normal rate Respiratory: normal effort GI: Abd soft, non-tender. Pos BS x 4 MS: Extremities nontender, no edema, normal ROM Neurologic: Alert and oriented x 4.  GU: Neg CVAT.  PELVIC EXAM:  Deferred   LAB RESULTS Results for orders placed or performed during the hospital encounter of 07/31/19 (from the past 24 hour(s))  Urinalysis, Routine w reflex microscopic     Status: Abnormal   Collection Time: 07/31/19  3:14 PM  Result Value Ref Range   Color, Urine YELLOW YELLOW   APPearance HAZY (A) CLEAR   Specific Gravity, Urine 1.012 1.005 - 1.030   pH 7.0 5.0 - 8.0   Glucose, UA NEGATIVE NEGATIVE mg/dL   Hgb urine dipstick LARGE (A) NEGATIVE   Bilirubin Urine NEGATIVE NEGATIVE   Ketones, ur NEGATIVE NEGATIVE mg/dL   Protein, ur NEGATIVE NEGATIVE mg/dL   Nitrite NEGATIVE NEGATIVE   Leukocytes,Ua LARGE (A) NEGATIVE   RBC / HPF 21-50 0 - 5 RBC/hpf   WBC, UA 21-50 0 - 5 WBC/hpf   Bacteria, UA FEW (A) NONE SEEN   Squamous Epithelial / LPF 0-5 0 - 5   Mucus PRESENT     --/--/O POS (03/15 1751)  IMAGING No results found.  MAU Management/MDM: Orders Placed This Encounter  Procedures  . Urinalysis, Routine w reflex microscopic  . Discharge patient    Meds ordered this encounter  Medications  . ketorolac (TORADOL) injection 60 mg  . ibuprofen (ADVIL) 600 MG tablet    Sig: Take 1 tablet (600 mg total) by mouth every 6 (six) hours as needed for mild pain.    Dispense:  30 tablet    Refill:  0    Order Specific Question:   Supervising Provider    Answer:   Woodroe Mode [[id number]]    Pain is c/w sciatica, possibly exacerbated by recent vaginal delivery/positioning for labor. Toradol 60 mg IM given x 1 with complete relief of symptoms. D/C home with ibuprofen 600 Q 6 hours. Stretching exercises given to pt for sciatica.  F/U virtual visit next week, refer to PT if pain persists. Return to MAU for emergencies.    ASSESSMENT 1. Sciatica of left side without back pain   2. Postpartum care following vaginal delivery     PLAN Discharge home Allergies as of 07/31/2019   No Known Allergies     Medication List    TAKE these medications   Blood Pressure Monitor Kit 1  Device by Does not apply route once a week. To be monitored Regularly at home.   ibuprofen 600 MG tablet Commonly known as: ADVIL Take 1 tablet (600 mg total) by mouth every 6 (six) hours as needed for mild pain. What changed: when to take this   Integra 62.5-62.5-40-3 MG Caps Take 1 capsule by mouth daily.      Follow-up Information    [facility] Follow up.   Why: The office will call you Monday, 3/22 to schedule virtual follow up visit.  If pain continues, consider referal to physical therapy.  Return to MAU as needed for emergencies. Contact information: [address] [place] [id number] [place] Certified Nurse-Midwife 07/31/2019  5:01 PM

## 2019-08-04 ENCOUNTER — Telehealth: Payer: Self-pay | Admitting: Advanced Practice Midwife

## 2019-08-04 NOTE — Telephone Encounter (Signed)
Called pt to check on sciatica from MAU visit on 07/31/19.  Pt doing better, pain only occasional now and resolved by ibuprofen.  No other concerns.  PP visit scheduled 08/25/19 at [facility].  Call office with any concerns before visit.  Pt states understanding.

## 2019-08-25 ENCOUNTER — Other Ambulatory Visit: Payer: Self-pay

## 2019-08-25 ENCOUNTER — Ambulatory Visit (INDEPENDENT_AMBULATORY_CARE_PROVIDER_SITE_OTHER): Payer: BC Managed Care – PPO | Admitting: Advanced Practice Midwife

## 2019-08-25 ENCOUNTER — Encounter: Payer: Self-pay | Admitting: Advanced Practice Midwife

## 2019-08-25 VITALS — BP 133/92 | HR 70 | Wt 190.4 lb

## 2019-08-25 DIAGNOSIS — Z30017 Encounter for initial prescription of implantable subdermal contraceptive: Secondary | ICD-10-CM | POA: Diagnosis not present

## 2019-08-25 DIAGNOSIS — Z1332 Encounter for screening for maternal depression: Secondary | ICD-10-CM

## 2019-08-25 MED ORDER — ETONOGESTREL 68 MG ~~LOC~~ IMPL
68.0000 mg | DRUG_IMPLANT | Freq: Once | SUBCUTANEOUS | Status: AC
  Administered 2019-08-25: 68 mg via SUBCUTANEOUS

## 2019-08-25 NOTE — Progress Notes (Signed)
[facility] Partum Visit Note  Brittany Rhodes is a 24 y.o. G40P1001 female who presents for a postpartum visit. She is 4 weeks postpartum following a normal spontaneous vaginal delivery. I have fully reviewed the prenatal and intrapartum course. The delivery was at [redacted]w[redacted]d gestational weeks.  Anesthesia: epidural. Postpartum course has been unremarkable. Baby is doing well unremarkable. Baby is feeding by bottle - Goodstart Soothe. Bleeding spotting. Bowel function is normal. Bladder function is normal. Patient is not sexually active. Contraception method is none. Postpartum depression screening: negative. EPDS = 0  The following portions of the patient's history were reviewed and updated as appropriate: allergies, current medications, past family history, past medical history, past social history, past surgical history and problem list.  Review of Systems Pertinent items noted in HPI and remainder of comprehensive ROS otherwise negative.    Objective:  Blood pressure (!) 151/91, pulse 75, weight 190 lb 6.4 oz (86.4 kg), unknown if currently breastfeeding.   Retake BP 130s/90s   VS reviewed, nursing note reviewed,  Constitutional: well developed, well nourished, no distress HEENT: normocephalic CV: normal rate Pulm/chest wall: normal effort Abdomen: soft Neuro: alert and oriented x 3 Skin: warm, dry Psych: affect normal    Nexplanon Insertion Procedure Patient identified, informed consent performed, consent signed.   Patient does understand that irregular bleeding is a very common side effect of this medication. She was advised to have backup contraception for one week after placement. Pregnancy test in clinic today was negative.  Appropriate time out taken.  Patient's left arm was prepped and draped in the usual sterile fashion.  Patient was prepped with alcohol swab and then injected with 3 ml of 1% lidocaine.  She was prepped with betadine, Nexplanon removed from packaging,  Device  confirmed in needle, then inserted full length of needle and withdrawn per handbook instructions. Nexplanon was able to palpated in the patient's arm; patient palpated the insert herself. There was minimal blood loss.  Patient insertion site covered with gauze and a pressure bandage to reduce any bruising.  The patient tolerated the procedure well and was given post procedure instructions.    Assessment:   1. Nexplanon insertion --Pt with questions, concerns about things she read on the internet.  Discussed studies about Nexplanon, no association with fertility issues after Nexplanon removal.  Side effects include irregular bleeding, may include headaches. Pt had Nexplanon before, was just nervous when she read something about fertility issues.  --Discussed Nexplanon vs IUD, pt desires Nexplanon today, will consider IUD if bleeding or other issues. --Nexplanon placed without difficulty, see above procedure note. - POCT urine pregnancy - etonogestrel (NEXPLANON) implant 68 mg  2. Postpartum exam --Doing well, good support.  Is no longer breastfeeding. Discussed seeing lactation consultant and trying to work on latch, continue breastfeeding vs stopping and continuing bottle feeding.  Pros and cons discussed of each.  Validated pt choices.  She prefers to stop breastfeeding.  She has not pumped or latched infant in 3 days and has no breast discomfort.  No further treatments needed. Discussed wearing supportive bra and decreasing any breast stimulation.   Plan:   Essential components of care per ACOG recommendations:  1.  Mood and well being: Patient with negative depression screening today. Reviewed local resources for support.  - Patient does not use tobacco.   2. Infant care and feeding:  -Patient currently breastmilk feeding? No   -Social determinants of health (SDOH) reviewed in EPIC. No concerns  3. Sexuality,  contraception and birth spacing - Patient does not want a pregnancy in the  next year.   - Reviewed forms of contraception in tiered fashion. Patient desired Nexplanon today.     4. Sleep and fatigue -Encouraged family/partner/community support of 4 hrs of uninterrupted sleep to help with mood and fatigue  5. Physical Recovery  - Discussed patients delivery - Patient had a second degree laceration, perineal healing reviewed. Patient expressed understanding - Patient has urinary incontinence? No - Patient is safe to resume physical and sexual activity  6.  Health Maintenance - Last pap smear done 01/13/2019 and was normal with negative HPV.   7. No Chronic Disease but HTN on today's visit only - PCP follow up as planned at [facility]  Sharen Counter, CNM Center for [employer], [facility]

## 2019-09-25 ENCOUNTER — Other Ambulatory Visit (INDEPENDENT_AMBULATORY_CARE_PROVIDER_SITE_OTHER): Payer: Self-pay

## 2021-10-15 ENCOUNTER — Other Ambulatory Visit: Payer: Self-pay

## 2021-10-15 ENCOUNTER — Emergency Department (HOSPITAL_COMMUNITY)
Admission: EM | Admit: 2021-10-15 | Discharge: 2021-10-15 | Disposition: A | Discharge status: Home / Self Care | Payer: BC Managed Care – PPO | Attending: Emergency Medicine | Admitting: Emergency Medicine

## 2021-10-15 ENCOUNTER — Emergency Department (HOSPITAL_COMMUNITY): Payer: BC Managed Care – PPO

## 2021-10-15 ENCOUNTER — Encounter (HOSPITAL_COMMUNITY): Payer: Self-pay

## 2021-10-15 DIAGNOSIS — S92515A Nondisplaced fracture of proximal phalanx of left lesser toe(s), initial encounter for closed fracture: Secondary | ICD-10-CM | POA: Diagnosis not present

## 2021-10-15 DIAGNOSIS — S99922A Unspecified injury of left foot, initial encounter: Secondary | ICD-10-CM | POA: Diagnosis present

## 2021-10-15 DIAGNOSIS — S92505A Nondisplaced unspecified fracture of left lesser toe(s), initial encounter for closed fracture: Secondary | ICD-10-CM

## 2021-10-15 DIAGNOSIS — W108XXA Fall (on) (from) other stairs and steps, initial encounter: Secondary | ICD-10-CM | POA: Insufficient documentation

## 2021-10-15 MED ORDER — IBUPROFEN 800 MG PO TABS
800.0000 mg | ORAL_TABLET | Freq: Once | ORAL | Status: AC
  Administered 2021-10-15: 800 mg via ORAL
  Filled 2021-10-15: qty 1

## 2021-10-15 NOTE — ED Provider Notes (Signed)
[facility] DEPT Provider Note   CSN: [id number] Arrival date & time: 10/15/21  1835     History Chief Complaint  Patient presents with   Toe Injury    Left pinky toe    Brittany Rhodes is a 26 y.o. female who presents to the emergency department today with left pinky toe injury that occurred last night.  Patient states that she tripped down the stairs last night and she states her pinky toe was out of place.  She popped it back into place but has had pain since.  Some trouble walking secondary to her pain.  HPI     Home Medications Prior to Admission medications   Medication Sig Start Date End Date Taking? Authorizing Provider  Blood Pressure Monitor KIT 1 Device by Does not apply route once a week. To be monitored Regularly at home. 01/13/19   Leftwich-Kirby, Kathie Dike, CNM  Fe Fum-FePoly-Vit C-Vit B3 (INTEGRA) 62.5-62.5-40-3 MG CAPS Take 1 capsule by mouth daily. Patient not taking: Reported on 08/25/2019 06/02/19   Leftwich-Kirby, Lattie Haw A, CNM  ibuprofen (ADVIL) 600 MG tablet Take 1 tablet (600 mg total) by mouth every 6 (six) hours as needed for mild pain. Patient not taking: Reported on 08/25/2019 07/31/19   Elvera Maria, CNM      Allergies    Patient has no known allergies.    Review of Systems   Review of Systems  All other systems reviewed and are negative.  Physical Exam Updated Vital Signs BP (!) 161/102 (BP Location: Right Arm)   Pulse 90   Temp 98.1 F (36.7 C) (Oral)   Resp 18   Ht _0  (1.651 m)   Wt 99.8 kg   LMP 09/26/2021   SpO2 99%   BMI 36.61 kg/m  Physical Exam Vitals and nursing note reviewed.  Constitutional:      Appearance: Normal appearance.  HENT:     Head: Normocephalic and atraumatic.  Eyes:     General:        Right eye: No discharge.        Left eye: No discharge.     Conjunctiva/sclera: Conjunctivae normal.  Pulmonary:     Effort: Pulmonary effort is normal.  Musculoskeletal:      Comments: There is tenderness to the left fifth metatarsal.  No obvious deformity or significant swelling.  Skin:    General: Skin is warm and dry.     Findings: No rash.  Neurological:     General: No focal deficit present.     Mental Status: She is alert.  Psychiatric:        Mood and Affect: Mood normal.        Behavior: Behavior normal.    ED Results / Procedures / Treatments   Labs (all labs ordered are listed, but only abnormal results are displayed) Labs Reviewed - No data to display  EKG None  Radiology DG Foot Complete Left  Result Date: 10/15/2021 CLINICAL DATA:  left pinky toe pain EXAM: LEFT FOOT - COMPLETE 3+ VIEW COMPARISON:  None Available. FINDINGS: There is a nondisplaced fracture of the base of the fifth proximal phalanx. There is no definitive intra-articular extension. Soft tissue edema. No unexpected radiopaque foreign body. IMPRESSION: Nondisplaced fracture of the base of the fifth proximal phalanx without definitive intra-articular extension. Electronically Signed   By: Valentino Saxon M.D.   On: 10/15/2021 19:21    Procedures Procedures    Medications Ordered in ED Medications  ibuprofen (ADVIL) tablet 800 mg (800 mg Oral Given 10/15/21 1948)    ED Course/ Medical Decision Making/ A&P                           Medical Decision Making Brittany Rhodes is a 26 y.o. female who presents to the emergency department today for further evaluation of pinky toe injury.  This is likely either a contusion versus fracture.  We will get imaging.  I personally interpreted the images and it looks like there is a small nondisplaced fracture over the fifth left metatarsal.  I will give the patient Ortho shoe in addition to 800 milligrams of Motrin.  We discussed appropriate care at home.  I will have her take Tylenol or Motrin at home as well.  Strict turn precaution discussed.  She is safe for discharge.   Amount and/or Complexity of Data Reviewed Radiology:  ordered.  Risk Prescription drug management.   Final Clinical Impression(s) / ED Diagnoses Final diagnoses:  Closed nondisplaced fracture of phalanx of lesser toe of left foot, unspecified phalanx, initial encounter    Rx / DC Orders ED Discharge Orders     None         Hendricks Limes, [place] 10/15/21 1953    Sherwood Gambler, MD 10/15/21 2259

## 2021-10-15 NOTE — ED Triage Notes (Signed)
Pt reports falling down stairs and hitting her left pinky toe. Pt states it was out of place and she put a rubber band around it. This morning the toe was swollen and painful. Pt reports bruising on her left foot,

## 2021-10-15 NOTE — Discharge Instructions (Signed)
Please use Ortho shoe for comfort.  I would like for you to take Tylenol or Motrin for pain.  This will get better in time.  Please return to the emergency part for any worsening symptoms.

## 2022-10-23 ENCOUNTER — Ambulatory Visit (INDEPENDENT_AMBULATORY_CARE_PROVIDER_SITE_OTHER): Payer: BC Managed Care – PPO | Admitting: Certified Nurse Midwife

## 2022-10-23 ENCOUNTER — Other Ambulatory Visit (HOSPITAL_COMMUNITY)
Admission: RE | Admit: 2022-10-23 | Discharge: 2022-10-23 | Disposition: A | Discharge status: Home / Self Care | Payer: BC Managed Care – PPO | Source: Ambulatory Visit | Attending: Certified Nurse Midwife | Admitting: Certified Nurse Midwife

## 2022-10-23 ENCOUNTER — Encounter: Payer: Self-pay | Admitting: Certified Nurse Midwife

## 2022-10-23 VITALS — BP 156/98 | HR 67 | Ht 65.0 in | Wt 232.4 lb

## 2022-10-23 DIAGNOSIS — Z113 Encounter for screening for infections with a predominantly sexual mode of transmission: Secondary | ICD-10-CM

## 2022-10-23 DIAGNOSIS — Z01419 Encounter for gynecological examination (general) (routine) without abnormal findings: Secondary | ICD-10-CM

## 2022-10-23 DIAGNOSIS — Z3009 Encounter for other general counseling and advice on contraception: Secondary | ICD-10-CM

## 2022-10-23 DIAGNOSIS — Z01411 Encounter for gynecological examination (general) (routine) with abnormal findings: Secondary | ICD-10-CM | POA: Diagnosis not present

## 2022-10-23 DIAGNOSIS — N6332 Unspecified lump in axillary tail of the left breast: Secondary | ICD-10-CM

## 2022-10-23 DIAGNOSIS — Z3046 Encounter for surveillance of implantable subdermal contraceptive: Secondary | ICD-10-CM | POA: Diagnosis not present

## 2022-10-23 DIAGNOSIS — Z124 Encounter for screening for malignant neoplasm of cervix: Secondary | ICD-10-CM

## 2022-10-23 MED ORDER — ETONOGESTREL 68 MG ~~LOC~~ IMPL
68.0000 mg | DRUG_IMPLANT | Freq: Once | SUBCUTANEOUS | Status: AC
  Administered 2022-10-23: 68 mg via SUBCUTANEOUS

## 2022-10-23 NOTE — Progress Notes (Signed)
Pt presents for AEX.  Last PAP 01/2019 Declines STD testing Pt has Nexplanon, expired 08/2022. Requesting removal and reinsertion.

## 2022-10-23 NOTE — Progress Notes (Signed)
GYNECOLOGY OFFICE VISIT NOTE  History:   Brittany Rhodes is a 27 y.o. G1P1001 here today for annual well woman exam with Pap smear collection. She also desires to have her Nexplanon removed and replaced today.  She denies any abnormal vaginal discharge, bleeding, pelvic pain or other vaginal concerns. She also reports a small lump in the left breast tail up into her left axilla with left breast tenderness. She Reports SBE and normal to her. She is currently on her menstrual period and request STD testing.      Past Medical History:  Diagnosis Date   Anemia 04/2019   anemia in pregnancy    Past Surgical History:  Procedure Laterality Date   NO PAST SURGERIES      The following portions of the patient's history were reviewed and updated as appropriate: allergies, current medications, past family history, past medical history, past social history, past surgical history and problem list.   Health Maintenance:  Normal pap and negative HRHPV on 01/13/19.    Review of Systems:  Pertinent items noted in HPI and remainder of comprehensive ROS otherwise negative.  Physical Exam:  BP (!) 156/98   Pulse 67   Ht 5\' 5"  (1.651 m)   Wt 232 lb 6.4 oz (105.4 kg)   LMP 10/21/2022   BMI 38.67 kg/m  CONSTITUTIONAL: Well-developed, well-nourished female in no acute distress.  HEENT:  Normocephalic, atraumatic. External right and left ear normal. No scleral icterus.  NECK: Normal range of motion, supple, no masses noted on observation SKIN: No rash noted. Not diaphoretic. No erythema. No pallor. BREAST: Breast overall non-tender . No apparent lesions or redness noted. No nipple discharge of either breast. No overt swelling.  Tenderness noted in left breast tail up into the axilla. Tenderness on deep palpation noted.  Small hard fixated pea-sized nodule noted in left axilla underneath the skin.   MUSCULOSKELETAL: Normal range of motion. No edema noted. NEUROLOGIC: Alert and oriented to person,  place, and time. Normal muscle tone coordination. No cranial nerve deficit noted. PSYCHIATRIC: Normal mood and affect. Normal behavior. Normal judgment and thought content. CARDIOVASCULAR: Normal heart rate noted RESPIRATORY: Effort and breath sounds normal, no problems with respiration noted ABDOMEN: No masses noted. No other overt distention noted.   PELVIC: Normal appearing external genitalia; normal urethral meatus; normal appearing vaginal mucosa and cervix.  No abnormal discharge noted.  Normal uterine size, no other palpable masses, no uterine or adnexal tenderness. Performed in the presence of a chaperone  Labs and Imaging No results found for this or any previous visit (from the past 168 hour(s)). No results found.    Assessment and Plan:    1. Well woman exam - Patient overall doing well today.  - Cytology - PAP( [place])  2. Screening examination for STD (sexually transmitted disease) - STD testing completed today per patient request.  - Cervicovaginal ancillary only( )  3. Birth control counseling - Nexplanon expired April 2024.  - Request removal and replacement. She does not desire pregnancy within the next 1-2 years. She has had no complaints with nexplanon.   4. Pap smear for cervical cancer screening - Pap collected today.  - Plan to notify patient of concerning results.   5. Encounter for removal and reinsertion of Nexplanon - Please see procedure note for details.  - Nexplanon inserted today.   6. Unspecified lump in axillary tail of the left breast - Breast US referral today.  - US BREAST COMPLETE  UNI LEFT INC AXILLA; Future   Routine preventative health maintenance measures emphasized. Please refer to After Visit Summary for other counseling recommendations.   Return in about 1 year (around 10/23/2023) for [place].    I spent 30 minutes dedicated to the care of this patient including pre-visit review of records, face to face time with the  patient discussing her conditions and treatments and post visit orders.   Brailyn Delman Danella Deis) Suzie Portela, MSN, CNM  Center for [facility]  10/23/22 2:12 PM

## 2022-10-23 NOTE — Progress Notes (Signed)
GYNECOLOGY CLINIC PROCEDURE NOTE  Ms. Brittany Rhodes is a 27 y.o. G1P1001 here for Nexplanon removal &  Nexplanon insertion. No GYN concerns.  Last pap smear was on 01/13/2019 and was normal.  No other gynecologic concerns.  Nexplanon Removal and Insertion  Patient was given informed consent for removal of her Nexplanon and insertion of Nexplanon.  Patient does understand that irregular bleeding is a very common side effect of this medication. She was advised to have backup contraception for one week after replacement of the implant. Pregnancy test in clinic today was negative.  Appropriate time out taken. Nexplanon site identified. Area prepped in usual sterile fashon. One ml of 1% lidocaine was used to anesthetize the area at the distal end of the implant. A small stab incision was made parallel to the implant on the distal portion. The Nexplanon rod was grasped using hemostats and removed without difficulty. There was minimal blood loss. There were no complications. Area was then injected with 3 ml of 1 % lidocaine. She was re-prepped with betadine, Nexplanon removed from packaging, Device confirmed in needle, then inserted full length of needle and withdrawn per handbook instructions. Nexplanon was able to palpated in the patient's arm; patient palpated the insert herself.  There was minimal blood loss. Patient insertion site covered with guaze and a pressure bandage to reduce any bruising. The patient tolerated the procedure well and was given post procedure instructions.   Brittany Rhodes) Brittany Portela, MSN, CNM  Center for [facility]  10/23/2022 2:25 PM

## 2022-10-24 LAB — CERVICOVAGINAL ANCILLARY ONLY
Bacterial Vaginitis (gardnerella): NEGATIVE
Candida Glabrata: NEGATIVE
Candida Vaginitis: POSITIVE — AB
Chlamydia: NEGATIVE
Comment: NEGATIVE
Comment: NEGATIVE
Comment: NEGATIVE
Comment: NEGATIVE
Comment: NEGATIVE
Comment: NORMAL
Neisseria Gonorrhea: NEGATIVE
Trichomonas: NEGATIVE

## 2022-10-24 MED ORDER — TERCONAZOLE 0.4 % VA CREA: 1.0000 | TOPICAL_CREAM | Freq: Every day | VAGINAL | 0 refills | Status: AC

## 2022-10-24 NOTE — Addendum Note (Signed)
Addended by: Carlynn Herald on: 10/24/2022 04:24 PM   Modules accepted: Orders

## 2022-10-25 ENCOUNTER — Encounter: Payer: Self-pay | Admitting: Certified Nurse Midwife

## 2022-10-29 LAB — CYTOLOGY - PAP
Comment: NEGATIVE
Diagnosis: UNDETERMINED — AB
High risk HPV: NEGATIVE

## 2022-11-07 ENCOUNTER — Ambulatory Visit
Admission: RE | Admit: 2022-11-07 | Discharge: 2022-11-07 | Disposition: A | Discharge status: Home / Self Care | Payer: BC Managed Care – PPO | Source: Ambulatory Visit | Attending: Certified Nurse Midwife | Admitting: Certified Nurse Midwife

## 2022-11-07 DIAGNOSIS — N6332 Unspecified lump in axillary tail of the left breast: Secondary | ICD-10-CM

## 2024-02-10 IMAGING — CR DG FOOT COMPLETE 3+V*L*
3 series · 3 of 3 positions shown · non-contrast
Comparison: None Available.

CLINICAL DATA: left pinky toe pain

EXAM:
LEFT FOOT - COMPLETE 3+ VIEW

[x foot ap left]
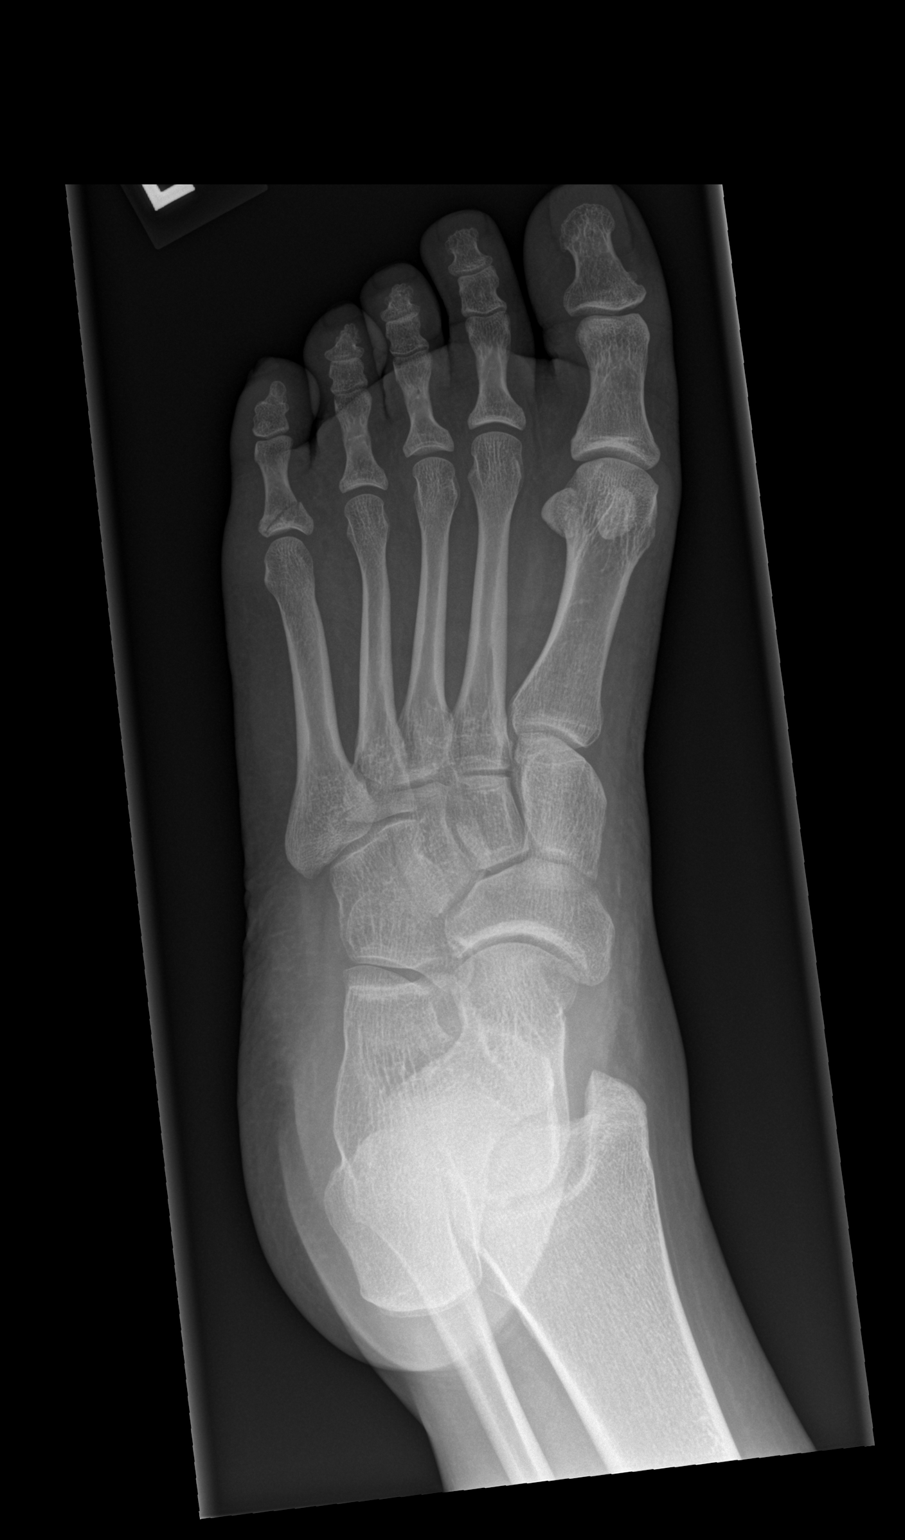

[x foot obl left]
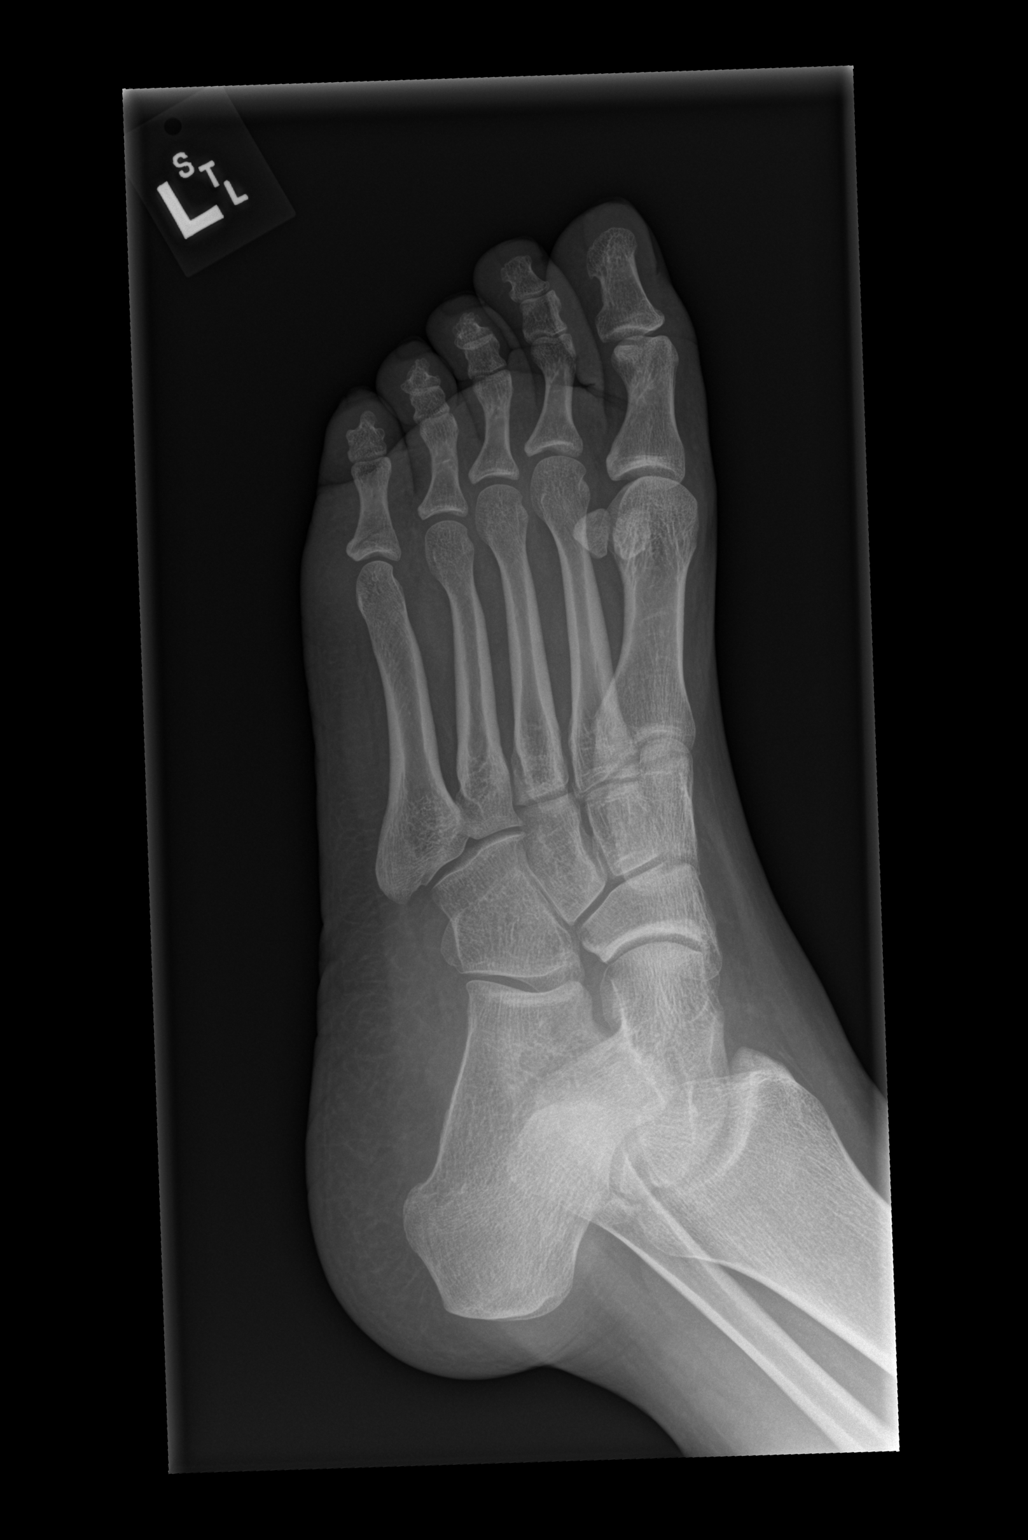

[x foot lat left]
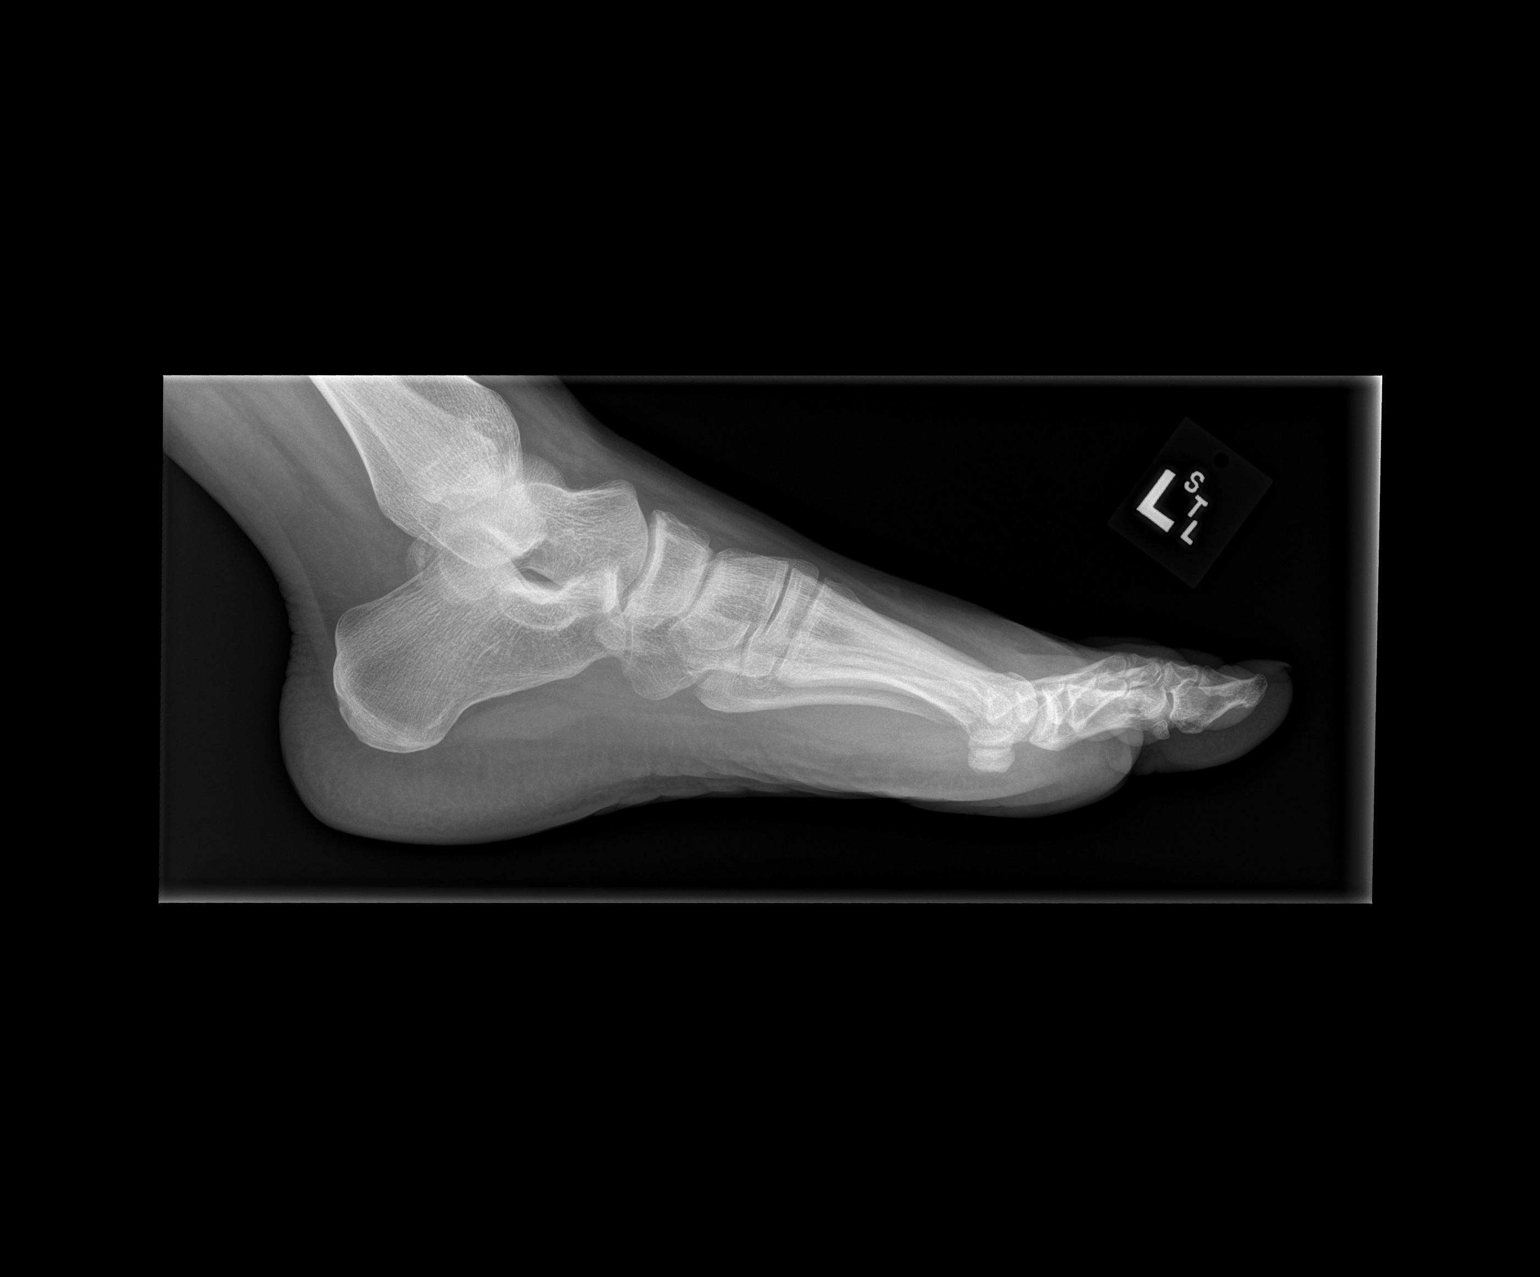

[3 of 3 positions shown; findings below may reference images not displayed]

FINDINGS: There is a nondisplaced fracture of the base of the fifth proximal
phalanx. There is no definitive intra-articular extension. Soft
tissue edema. No unexpected radiopaque foreign body.
IMPRESSION: Nondisplaced fracture of the base of the fifth proximal phalanx
without definitive intra-articular extension.
